# Patient Record
Sex: Female | Born: 1960 | Race: White | Hispanic: No | Smoking: Former smoker
Health system: Southern US, Community
[De-identification: ages and names within clinical notes are randomized; demographics above are authoritative.]

## PROBLEM LIST (undated history)

## (undated) DIAGNOSIS — R519 Headache, unspecified: Secondary | ICD-10-CM

## (undated) DIAGNOSIS — M549 Dorsalgia, unspecified: Secondary | ICD-10-CM

## (undated) DIAGNOSIS — M1990 Unspecified osteoarthritis, unspecified site: Secondary | ICD-10-CM

## (undated) DIAGNOSIS — N200 Calculus of kidney: Secondary | ICD-10-CM

## (undated) DIAGNOSIS — A048 Other specified bacterial intestinal infections: Secondary | ICD-10-CM

## (undated) DIAGNOSIS — K219 Gastro-esophageal reflux disease without esophagitis: Secondary | ICD-10-CM

## (undated) DIAGNOSIS — R51 Headache: Secondary | ICD-10-CM

## (undated) DIAGNOSIS — M545 Low back pain: Secondary | ICD-10-CM

## (undated) DIAGNOSIS — M797 Fibromyalgia: Secondary | ICD-10-CM

## (undated) DIAGNOSIS — Z720 Tobacco use: Secondary | ICD-10-CM

## (undated) DIAGNOSIS — I10 Essential (primary) hypertension: Secondary | ICD-10-CM

## (undated) HISTORY — PX: URETERAL STENT PLACEMENT: SHX822

## (undated) HISTORY — PX: SALPINGOOPHORECTOMY: SHX82

## (undated) HISTORY — PX: TUBAL LIGATION: SHX77

---

## 2000-05-19 ENCOUNTER — Other Ambulatory Visit: Admission: RE | Admit: 2000-05-19 | Discharge: 2000-05-19 | Payer: Self-pay | Admitting: Family Medicine

## 2005-08-18 ENCOUNTER — Ambulatory Visit: Payer: Self-pay | Admitting: Family Medicine

## 2005-09-01 ENCOUNTER — Ambulatory Visit: Payer: Self-pay | Admitting: Family Medicine

## 2007-10-13 ENCOUNTER — Emergency Department (HOSPITAL_COMMUNITY): Admission: EM | Admit: 2007-10-13 | Discharge: 2007-10-13 | Payer: Self-pay | Admitting: Emergency Medicine

## 2010-12-10 ENCOUNTER — Emergency Department (HOSPITAL_COMMUNITY): Payer: Medicaid Other

## 2010-12-10 ENCOUNTER — Encounter: Payer: Self-pay | Admitting: *Deleted

## 2010-12-10 ENCOUNTER — Observation Stay (HOSPITAL_COMMUNITY)
Admission: EM | Admit: 2010-12-10 | Discharge: 2010-12-12 | Disposition: A | Discharge status: Other Acute Inpatient Hospital | Payer: Medicaid Other | Attending: Internal Medicine | Admitting: Internal Medicine

## 2010-12-10 ENCOUNTER — Other Ambulatory Visit: Payer: Self-pay

## 2010-12-10 DIAGNOSIS — F17200 Nicotine dependence, unspecified, uncomplicated: Secondary | ICD-10-CM | POA: Insufficient documentation

## 2010-12-10 DIAGNOSIS — M545 Low back pain, unspecified: Secondary | ICD-10-CM

## 2010-12-10 DIAGNOSIS — Z720 Tobacco use: Secondary | ICD-10-CM

## 2010-12-10 DIAGNOSIS — R1013 Epigastric pain: Secondary | ICD-10-CM | POA: Insufficient documentation

## 2010-12-10 DIAGNOSIS — R519 Headache, unspecified: Secondary | ICD-10-CM | POA: Diagnosis present

## 2010-12-10 DIAGNOSIS — A048 Other specified bacterial intestinal infections: Secondary | ICD-10-CM

## 2010-12-10 DIAGNOSIS — E785 Hyperlipidemia, unspecified: Secondary | ICD-10-CM | POA: Insufficient documentation

## 2010-12-10 DIAGNOSIS — R079 Chest pain, unspecified: Secondary | ICD-10-CM | POA: Diagnosis present

## 2010-12-10 DIAGNOSIS — R072 Precordial pain: Principal | ICD-10-CM | POA: Insufficient documentation

## 2010-12-10 DIAGNOSIS — I10 Essential (primary) hypertension: Secondary | ICD-10-CM

## 2010-12-10 DIAGNOSIS — K219 Gastro-esophageal reflux disease without esophagitis: Secondary | ICD-10-CM

## 2010-12-10 DIAGNOSIS — Z8249 Family history of ischemic heart disease and other diseases of the circulatory system: Secondary | ICD-10-CM | POA: Insufficient documentation

## 2010-12-10 DIAGNOSIS — R51 Headache: Secondary | ICD-10-CM | POA: Insufficient documentation

## 2010-12-10 DIAGNOSIS — D72829 Elevated white blood cell count, unspecified: Secondary | ICD-10-CM | POA: Diagnosis present

## 2010-12-10 DIAGNOSIS — G894 Chronic pain syndrome: Secondary | ICD-10-CM | POA: Insufficient documentation

## 2010-12-10 HISTORY — DX: Low back pain: M54.5

## 2010-12-10 HISTORY — DX: Fibromyalgia: M79.7

## 2010-12-10 HISTORY — DX: Calculus of kidney: N20.0

## 2010-12-10 HISTORY — DX: Gastro-esophageal reflux disease without esophagitis: K21.9

## 2010-12-10 HISTORY — DX: Essential (primary) hypertension: I10

## 2010-12-10 HISTORY — DX: Unspecified osteoarthritis, unspecified site: M19.90

## 2010-12-10 HISTORY — DX: Low back pain, unspecified: M54.50

## 2010-12-10 HISTORY — DX: Tobacco use: Z72.0

## 2010-12-10 HISTORY — DX: Dorsalgia, unspecified: M54.9

## 2010-12-10 HISTORY — DX: Other specified bacterial intestinal infections: A04.8

## 2010-12-10 HISTORY — DX: Headache: R51

## 2010-12-10 HISTORY — DX: Headache, unspecified: R51.9

## 2010-12-10 LAB — COMPREHENSIVE METABOLIC PANEL
AST: 14 U/L (ref 0–37)
BUN: 17 mg/dL (ref 6–23)
CO2: 25 mEq/L (ref 19–32)
Calcium: 9.8 mg/dL (ref 8.4–10.5)
Chloride: 102 mEq/L (ref 96–112)
Creatinine, Ser: 0.7 mg/dL (ref 0.50–1.10)
GFR calc Af Amer: >90 mL/min (ref >90)
GFR calc non Af Amer: >90 mL/min (ref >90)
Glucose, Bld: 106 mg/dL — ABNORMAL HIGH (ref 70–99)
Total Bilirubin: 0.4 mg/dL (ref 0.3–1.2)

## 2010-12-10 LAB — CBC
HCT: 47.2 % — ABNORMAL HIGH (ref 36.0–46.0)
Hemoglobin: 16.2 g/dL — ABNORMAL HIGH (ref 12.0–15.0)
MCV: 92 fL (ref 78.0–100.0)
RBC: 5.13 MIL/uL — ABNORMAL HIGH (ref 3.87–5.11)
RDW: 12.4 % (ref 11.5–15.5)
WBC: 12.5 10*3/uL — ABNORMAL HIGH (ref 4.0–10.5)

## 2010-12-10 LAB — CARDIAC PANEL(CRET KIN+CKTOT+MB+TROPI)
Relative Index: INVALID (ref 0.0–2.5)
Total CK: 62 U/L (ref 7–177)
Troponin I: <0.3 ng/mL (ref <0.30)

## 2010-12-10 LAB — URINALYSIS, ROUTINE W REFLEX MICROSCOPIC
Ketones, ur: NEGATIVE mg/dL
Leukocytes, UA: NEGATIVE
Nitrite: NEGATIVE
Specific Gravity, Urine: >1.03 — ABNORMAL HIGH (ref 1.005–1.030)
Urobilinogen, UA: 0.2 mg/dL (ref 0.0–1.0)
pH: 5.5 (ref 5.0–8.0)

## 2010-12-10 LAB — DIFFERENTIAL
Basophils Absolute: 0.1 10*3/uL (ref 0.0–0.1)
Eosinophils Relative: 1 % (ref 0–5)
Lymphocytes Relative: 26 % (ref 12–46)
Lymphs Abs: 3.2 10*3/uL (ref 0.7–4.0)
Monocytes Absolute: 0.7 10*3/uL (ref 0.1–1.0)
Monocytes Relative: 5 % (ref 3–12)
Neutro Abs: 8.4 10*3/uL — ABNORMAL HIGH (ref 1.7–7.7)

## 2010-12-10 LAB — LIPASE, BLOOD: Lipase: 76 U/L — ABNORMAL HIGH (ref 11–59)

## 2010-12-10 LAB — URINE MICROSCOPIC-ADD ON

## 2010-12-10 LAB — POCT I-STAT TROPONIN I

## 2010-12-10 MED ORDER — ALUM & MAG HYDROXIDE-SIMETH 200-200-20 MG/5ML PO SUSP: 30.0000 mL | Freq: Four times a day (QID) | ORAL | Status: DC | PRN

## 2010-12-10 MED ORDER — ALBUTEROL SULFATE (5 MG/ML) 0.5% IN NEBU: 2.5000 mg | INHALATION_SOLUTION | RESPIRATORY_TRACT | Status: DC | PRN

## 2010-12-10 MED ORDER — SODIUM CHLORIDE 0.9 % IJ SOLN
3.0000 mL | INTRAMUSCULAR | Status: DC | PRN
  Administered 2010-12-10: 3 mL via INTRAVENOUS
  Filled 2010-12-10: qty 3

## 2010-12-10 MED ORDER — ONDANSETRON HCL 4 MG PO TABS: 4.0000 mg | ORAL_TABLET | Freq: Four times a day (QID) | ORAL | Status: DC | PRN

## 2010-12-10 MED ORDER — METOPROLOL SUCCINATE ER 25 MG PO TB24
12.5000 mg | ORAL_TABLET | Freq: Two times a day (BID) | ORAL | Status: DC
  Administered 2010-12-10 – 2010-12-11 (×2): 12.5 mg via ORAL
  Filled 2010-12-10 (×2): qty 1

## 2010-12-10 MED ORDER — CYCLOBENZAPRINE HCL 10 MG PO TABS
10.0000 mg | ORAL_TABLET | Freq: Every day | ORAL | Status: DC
Start: 2010-12-10 — End: 2010-12-12
  Administered 2010-12-10 – 2010-12-11 (×2): 10 mg via ORAL
  Filled 2010-12-10 (×2): qty 1

## 2010-12-10 MED ORDER — ASPIRIN 81 MG PO CHEW
81.0000 mg | CHEWABLE_TABLET | Freq: Every day | ORAL | Status: DC
  Administered 2010-12-11: 81 mg via ORAL
  Filled 2010-12-10: qty 1

## 2010-12-10 MED ORDER — NITROGLYCERIN 2 % TD OINT
1.0000 [in_us] | TOPICAL_OINTMENT | Freq: Once | TRANSDERMAL | Status: AC
  Administered 2010-12-10: 66.6667 [in_us] via TOPICAL
  Filled 2010-12-10: qty 1

## 2010-12-10 MED ORDER — GUAIFENESIN-DM 100-10 MG/5ML PO SYRP: 5.0000 mL | ORAL_SOLUTION | ORAL | Status: DC | PRN

## 2010-12-10 MED ORDER — SODIUM CHLORIDE 0.45 % IV SOLN
INTRAVENOUS | Status: DC
  Administered 2010-12-10 – 2010-12-11 (×2): via INTRAVENOUS
  Filled 2010-12-10 (×5): qty 1000

## 2010-12-10 MED ORDER — ENOXAPARIN SODIUM 40 MG/0.4ML ~~LOC~~ SOLN
40.0000 mg | SUBCUTANEOUS | Status: DC
  Administered 2010-12-10 – 2010-12-11 (×2): 40 mg via SUBCUTANEOUS
  Filled 2010-12-10 (×2): qty 0.4

## 2010-12-10 MED ORDER — DOCUSATE SODIUM 100 MG PO CAPS
100.0000 mg | ORAL_CAPSULE | Freq: Two times a day (BID) | ORAL | Status: DC
  Administered 2010-12-10 – 2010-12-11 (×3): 100 mg via ORAL
  Filled 2010-12-10 (×3): qty 1

## 2010-12-10 MED ORDER — ACETAMINOPHEN 325 MG PO TABS
650.0000 mg | ORAL_TABLET | Freq: Four times a day (QID) | ORAL | Status: DC | PRN
  Administered 2010-12-10 – 2010-12-11 (×2): 650 mg via ORAL
  Filled 2010-12-10 (×2): qty 2

## 2010-12-10 MED ORDER — ONDANSETRON HCL 4 MG/2ML IJ SOLN: 4.0000 mg | Freq: Four times a day (QID) | INTRAMUSCULAR | Status: DC | PRN

## 2010-12-10 MED ORDER — ASPIRIN 325 MG PO TABS
325.0000 mg | ORAL_TABLET | Freq: Once | ORAL | Status: AC
  Administered 2010-12-10: 325 mg via ORAL
  Filled 2010-12-10: qty 1

## 2010-12-10 MED ORDER — POTASSIUM CHLORIDE IN NACL 20-0.45 MEQ/L-% IV SOLN
INTRAVENOUS | Status: AC
  Filled 2010-12-10: qty 1000

## 2010-12-10 MED ORDER — MORPHINE SULFATE 2 MG/ML IJ SOLN: 2.0000 mg | INTRAMUSCULAR | Status: DC | PRN

## 2010-12-10 MED ORDER — ACETAMINOPHEN 650 MG RE SUPP: 650.0000 mg | Freq: Four times a day (QID) | RECTAL | Status: DC | PRN

## 2010-12-10 MED ORDER — OXYCODONE HCL 5 MG PO TABS
5.0000 mg | ORAL_TABLET | ORAL | Status: DC | PRN
  Administered 2010-12-10: 5 mg via ORAL
  Filled 2010-12-10: qty 1

## 2010-12-10 MED ORDER — TRAMADOL HCL 50 MG PO TABS: 50.0000 mg | ORAL_TABLET | Freq: Four times a day (QID) | ORAL | Status: DC | PRN

## 2010-12-10 MED ORDER — NITROGLYCERIN 0.4 MG/SPRAY TL SOLN: 1.0000 | Status: DC | PRN

## 2010-12-10 MED ORDER — PANTOPRAZOLE SODIUM 40 MG PO TBEC
40.0000 mg | DELAYED_RELEASE_TABLET | Freq: Every day | ORAL | Status: DC
  Administered 2010-12-10 – 2010-12-11 (×2): 40 mg via ORAL
  Filled 2010-12-10 (×2): qty 1

## 2010-12-10 MED ORDER — NITROGLYCERIN 0.4 MG SL SUBL: 0.4000 mg | SUBLINGUAL_TABLET | SUBLINGUAL | Status: DC | PRN

## 2010-12-10 NOTE — ED Notes (Signed)
To X-ray via carrier--Stable

## 2010-12-10 NOTE — ED Notes (Signed)
Report called to Clydie Braun for Room 308

## 2010-12-10 NOTE — ED Provider Notes (Signed)
History     CSN: [id number] Arrival date & time: 12/10/2010  1:17 PM  Chief Complaint  Patient presents with  . Chest Pain    (Consider location/radiation/quality/duration/timing/severity/associated sxs/prior treatment) Patient is a 50 y.o. female presenting with chest pain. The history is provided by the patient.  Chest Pain The chest pain began yesterday (Patient states that she started with chest pain last night she also had some shortness of breath weakness and jaw pain it lasted about 15-20 minutes and had some pain off and on the pain is a pressure sensation). Duration of episode(s) is 20 minutes. Chest pain occurs intermittently. The chest pain is improving. The pain is associated with stress. At its most intense, the pain is at 6/10. The pain is currently at 1/10. The quality of the pain is described as aching, dull and heavy. The pain radiates to the left jaw. Chest pain is worsened by stress. Primary symptoms include fatigue and shortness of breath. Pertinent negatives for primary symptoms include no fever, no syncope, no cough, no wheezing, no palpitations and no abdominal pain. She tried nothing for the symptoms. Risk factors include smoking/tobacco exposure (Both of the patient's parents died of MI one at 39 and 59 she has history of hypertension that's not treated and she is a smoker).  Pertinent negatives for past medical history include no aneurysm, no Marfan's syndrome and no seizures.  Her family medical history is significant for heart disease in family.  Procedure history is negative for cardiac catheterization.     Past Medical History  Diagnosis Date  . Back pain   . Osteoarthritis   . Fibromyalgia     Past Surgical History  Procedure Date  . Peg tube removal   . Tubal ligation   . Ureteral stent placement     History reviewed. No pertinent family history.  History  Substance Use Topics  . Smoking status: Current Everyday Smoker -- 0.5 packs/day  .  Smokeless tobacco: Not on file  . Alcohol Use: No    OB History    Grav Para Term Preterm Abortions TAB SAB Ect Mult Living                  Review of Systems  Constitutional: Positive for fatigue. Negative for fever.  HENT: Negative for congestion, sinus pressure and ear discharge.   Eyes: Negative for discharge.  Respiratory: Positive for shortness of breath. Negative for cough and wheezing.   Cardiovascular: Positive for chest pain. Negative for palpitations and syncope.  Gastrointestinal: Negative for abdominal pain and diarrhea.  Genitourinary: Negative for frequency and hematuria.  Musculoskeletal: Negative for back pain.  Skin: Negative for rash.  Neurological: Negative for seizures and headaches.  Hematological: Negative.   Psychiatric/Behavioral: Negative for hallucinations.    Allergies  Review of patient's allergies indicates no known allergies.  Home Medications   Current Outpatient Rx  Name Route Sig Dispense Refill  . CYCLOBENZAPRINE HCL 10 MG PO TABS Oral Take 10 mg by mouth daily.      . IBUPROFEN 200 MG PO TABS Oral Take 200 mg by mouth every 6 (six) hours as needed. For pain     . MEDROXYPROGESTERONE ACETATE 10 MG PO TABS Oral Take 10 mg by mouth daily.      Marland Kitchen NAPROXEN SODIUM 220 MG PO TABS Oral Take 220 mg by mouth every 8 (eight) hours as needed. For pain     . TRAMADOL HCL 50 MG PO TABS Oral Take 50 mg  by mouth every 6 (six) hours as needed. For pain       BP 129/91  Pulse 83  Temp(Src) 97.9 F (36.6 C) (Oral)  Resp 16  Ht 5\' 4"  (1.626 m)  Wt 152 lb (68.947 kg)  BMI 26.09 kg/m2  SpO2 98%  Physical Exam  Constitutional: She is oriented to person, place, and time. She appears well-developed.  HENT:  Head: Normocephalic and atraumatic.  Eyes: Conjunctivae and EOM are normal. No scleral icterus.  Neck: Neck supple. No thyromegaly present.  Cardiovascular: Normal rate and regular rhythm.  Exam reveals no gallop and no friction rub.   No murmur  heard. Pulmonary/Chest: No stridor. She has no wheezes. She has no rales. She exhibits no tenderness.  Abdominal: She exhibits no distension. There is no tenderness. There is no rebound.  Musculoskeletal: Normal range of motion. She exhibits no edema.  Lymphadenopathy:    She has no cervical adenopathy.  Neurological: She is oriented to person, place, and time. Coordination normal.  Skin: No rash noted. No erythema.  Psychiatric: She has a normal mood and affect. Her behavior is normal.    ED Course  Procedures (including critical care time)  Labs Reviewed  CBC - Abnormal; Notable for the following:    WBC 12.5 (*)    RBC 5.13 (*)    Hemoglobin 16.2 (*)    HCT 47.2 (*)    All other components within normal limits  DIFFERENTIAL - Abnormal; Notable for the following:    Neutro Abs 8.4 (*)    All other components within normal limits  COMPREHENSIVE METABOLIC PANEL - Abnormal; Notable for the following:    Glucose, Bld 106 (*)    All other components within normal limits  I-STAT TROPONIN I   Dg Chest 2 View  12/10/2010  *RADIOLOGY REPORT*  Clinical Data: Chest pain  CHEST - 2 VIEW  Comparison: None.  Findings: Normal heart size.  Clear lungs.  Mild bronchitic change. No pneumothorax or pleural fluid.  IMPRESSION: No active cardiopulmonary disease.  Original Report Authenticated By: Donavan Burnet, M.D.     1. Chest pain    Results for orders placed during the hospital encounter of 12/10/10  CBC      Component Value Range   WBC 12.5 (*) 4.0 - 10.5 (K/uL)   RBC 5.13 (*) 3.87 - 5.11 (MIL/uL)   Hemoglobin 16.2 (*) 12.0 - 15.0 (g/dL)   HCT 40.9 (*) 81.1 - 46.0 (%)   MCV 92.0  78.0 - 100.0 (fL)   MCH 31.6  26.0 - 34.0 (pg)   MCHC 34.3  30.0 - 36.0 (g/dL)   RDW 91.4  78.2 - 95.6 (%)   Platelets 330  150 - 400 (K/uL)  DIFFERENTIAL      Component Value Range   Neutrophils Relative 67  43 - 77 (%)   Neutro Abs 8.4 (*) 1.7 - 7.7 (K/uL)   Lymphocytes Relative 26  12 - 46 (%)    Lymphs Abs 3.2  0.7 - 4.0 (K/uL)   Monocytes Relative 5  3 - 12 (%)   Monocytes Absolute 0.7  0.1 - 1.0 (K/uL)   Eosinophils Relative 1  0 - 5 (%)   Eosinophils Absolute 0.2  0.0 - 0.7 (K/uL)   Basophils Relative 0  0 - 1 (%)   Basophils Absolute 0.1  0.0 - 0.1 (K/uL)  COMPREHENSIVE METABOLIC PANEL      Component Value Range   Sodium 138  135 - 145 (  mEq/L)   Potassium 4.3  3.5 - 5.1 (mEq/L)   Chloride 102  96 - 112 (mEq/L)   CO2 25  19 - 32 (mEq/L)   Glucose, Bld 106 (*) 70 - 99 (mg/dL)   BUN 17  6 - 23 (mg/dL)   Creatinine, Ser 5.28  0.50 - 1.10 (mg/dL)   Calcium 9.8  8.4 - 41.3 (mg/dL)   Total Protein 7.3  6.0 - 8.3 (g/dL)   Albumin 4.1  3.5 - 5.2 (g/dL)   AST 14  0 - 37 (U/L)   ALT 15  0 - 35 (U/L)   Alkaline Phosphatase 80  39 - 117 (U/L)   Total Bilirubin 0.4  0.3 - 1.2 (mg/dL)   GFR calc non Af Amer >90  >90 (mL/min)   GFR calc Af Amer >90  >90 (mL/min)   Dg Chest 2 View  12/10/2010  *RADIOLOGY REPORT*  Clinical Data: Chest pain  CHEST - 2 VIEW  Comparison: None.  Findings: Normal heart size.  Clear lungs.  Mild bronchitic change. No pneumothorax or pleural fluid.  IMPRESSION: No active cardiopulmonary disease.  Original Report Authenticated By: Donavan Burnet, M.D.     Date: 12/10/2010  Rate: 102  Rhythm: sinus tachycardia  QRS Axis: normal  Intervals: normal  ST/T Wave abnormalities: nonspecific T wave changes  Inverted t wave in lead 3.  Right atrial enlargement  Conduction Disutrbances:none  Narrative Interpretation:   Old EKG Reviewed: none available     MDM  Chest pain,  Angina, bronchitis, anxiety        Benny Lennert, MD 12/10/10 1525
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
## 2010-12-10 NOTE — ED Notes (Signed)
Returned to ER--placed back on monitor-- B/P 133/99  HR 77 SR  R 20--Reports complete resolution of chest discomfort since arriving and lying down.--placed back on 02 at 2 liters  P.o. 98%

## 2010-12-10 NOTE — ED Notes (Signed)
Tylenol R/S order is error---

## 2010-12-10 NOTE — ED Notes (Signed)
No Change in status--SR on monitor

## 2010-12-10 NOTE — ED Notes (Signed)
C/o headache---continues to report resolution of chest pain

## 2010-12-10 NOTE — ED Notes (Signed)
I STAT Trop result  0.00

## 2010-12-10 NOTE — ED Notes (Signed)
Pt c/o cp described as heaviness in the center of her chest. Pt states pain started yesterday with headache, epigastric, throat, jaw, and upper back. Pt states she has no other pain besides cp at this time.

## 2010-12-10 NOTE — ED Notes (Addendum)
C/o mid-sternal chest pain onset last p.m. Radiating thru to back and up to jaws bilaterally---nearly resolved spontaneoulsy--today mid sternal pressure like sensation increases with ambulation--rates pain a 3 on 1-10 scale--sinus rhythm on monitor--skin warm and dry--placed on 02 at 2 liters via nasal cannula  Smokes 1/2 pack per day for the past 20+ yrs.

## 2010-12-10 NOTE — ED Notes (Signed)
B/P 144/94  HR 74 SR--Dr. Estell Harpin discussing the possibility of admission with pt and she is agreeable.

## 2010-12-11 ENCOUNTER — Other Ambulatory Visit: Payer: Self-pay

## 2010-12-11 ENCOUNTER — Encounter (HOSPITAL_COMMUNITY): Payer: Self-pay | Admitting: Adult Health

## 2010-12-11 DIAGNOSIS — R079 Chest pain, unspecified: Secondary | ICD-10-CM

## 2010-12-11 DIAGNOSIS — E785 Hyperlipidemia, unspecified: Secondary | ICD-10-CM | POA: Diagnosis present

## 2010-12-11 LAB — BASIC METABOLIC PANEL
BUN: 18 mg/dL (ref 6–23)
Calcium: 9.3 mg/dL (ref 8.4–10.5)
Creatinine, Ser: 0.77 mg/dL (ref 0.50–1.10)
GFR calc Af Amer: >90 mL/min (ref >90)
GFR calc non Af Amer: >90 mL/min (ref >90)
Potassium: 4.1 mEq/L (ref 3.5–5.1)

## 2010-12-11 LAB — CBC
Hemoglobin: 15.1 g/dL — ABNORMAL HIGH (ref 12.0–15.0)
MCHC: 33.9 g/dL (ref 30.0–36.0)
Platelets: 320 10*3/uL (ref 150–400)
RDW: 12.6 % (ref 11.5–15.5)

## 2010-12-11 LAB — TSH: TSH: 2.28 u[IU]/mL (ref 0.350–4.500)

## 2010-12-11 LAB — LIPID PANEL
Cholesterol: 262 mg/dL — ABNORMAL HIGH (ref 0–200)
HDL: 37 mg/dL — ABNORMAL LOW (ref >39)
Total CHOL/HDL Ratio: 7.1 RATIO
VLDL: 66 mg/dL — ABNORMAL HIGH (ref 0–40)

## 2010-12-11 LAB — CARDIAC PANEL(CRET KIN+CKTOT+MB+TROPI)
CK, MB: 1.5 ng/mL (ref 0.3–4.0)
Total CK: 54 U/L (ref 7–177)

## 2010-12-11 MED ORDER — ENOXAPARIN SODIUM 80 MG/0.8ML ~~LOC~~ SOLN
70.0000 mg | Freq: Two times a day (BID) | SUBCUTANEOUS | Status: DC
  Administered 2010-12-12: 70 mg via SUBCUTANEOUS
  Filled 2010-12-11: qty 0.8

## 2010-12-11 MED ORDER — ENOXAPARIN SODIUM 30 MG/0.3ML ~~LOC~~ SOLN
30.0000 mg | Freq: Once | SUBCUTANEOUS | Status: AC
  Administered 2010-12-11: 30 mg via SUBCUTANEOUS
  Filled 2010-12-11: qty 0.3

## 2010-12-11 MED ORDER — SODIUM CHLORIDE 0.9 % IV SOLN
INTRAVENOUS | Status: DC
  Administered 2010-12-12: 06:00:00 via INTRAVENOUS

## 2010-12-11 MED ORDER — ROSUVASTATIN CALCIUM 20 MG PO TABS: 40.0000 mg | ORAL_TABLET | Freq: Every day | ORAL | Status: DC

## 2010-12-11 MED ORDER — NITROGLYCERIN IN D5W 200-5 MCG/ML-% IV SOLN: 10.0000 ug/min | INTRAVENOUS | Status: DC | PRN

## 2010-12-11 MED ORDER — TRAZODONE HCL 50 MG PO TABS
25.0000 mg | ORAL_TABLET | Freq: Every day | ORAL | Status: DC
  Administered 2010-12-11: 25 mg via ORAL
  Filled 2010-12-11: qty 1

## 2010-12-11 MED ORDER — METOPROLOL SUCCINATE ER 25 MG PO TB24
25.0000 mg | ORAL_TABLET | Freq: Three times a day (TID) | ORAL | Status: DC
  Administered 2010-12-11 (×2): 25 mg via ORAL
  Filled 2010-12-11 (×2): qty 1

## 2010-12-11 MED ORDER — ENOXAPARIN SODIUM 80 MG/0.8ML ~~LOC~~ SOLN: 70.0000 mg | Freq: Two times a day (BID) | SUBCUTANEOUS | Status: DC

## 2010-12-11 MED ORDER — ROSUVASTATIN CALCIUM 20 MG PO TABS
20.0000 mg | ORAL_TABLET | Freq: Every day | ORAL | Status: DC
  Administered 2010-12-11: 20 mg via ORAL
  Filled 2010-12-11: qty 1

## 2010-12-11 NOTE — Consult Note (Signed)
CARDIOLOGY CONSULT NOTE  Patient ID: Sarah Vargas MRN: [id number] DOB/AGE: 1960/05/10 50 y.o.  Admit date: 12/10/2010 Referring PhysicianTRH Primary Physician: Health Dept Primary Cardiologist(New) Maeva Dant Reason for Consultation: Chest pain  HPI: 50 y/o patient with no prior cardiac history presents to ER after experiencing chest pain the day prior. She began with a headache, climbed some stairs in her apartment and began to have chest pain described as heaviness, jaw pain bilaterally described as sharp/ache, with associated tightness in her throat.  She denies dyspnea, diaphoresis or nausea.  After 10 min of rest, pain abated. She continued to rest another 15 minutes. Felt better the rest of the day without recurrence.  Had neighbor take her BP and she states that it was 140/103.  The following day she was seen at [facility] and expressed her symptoms to them. She was sent to ER for further evaluation.  On arrival, she was hypertensive with BP 158/106. HR 117.  She was given SL NTG and then placed on patch. Given ASA 325 mg.  Admitted to r/o cardiac etiology of chest discomfort.  Initial EKG showed sinus tach with rate of 102 bpm, with T-wave inversion in Lead III and aVR. This am there is T-wave inversion in V1, V3, and Lead III, and flattening in AvF.  Troponin negative X 2 so far. Currently she is pain free.  Review of systems complete and found to be negative unless listed above  Past Medical History  Diagnosis Date  . Back pain   . Osteoarthritis   . Fibromyalgia   . Tobacco abuse 12/10/2010  . HTN (hypertension) 12/10/2010  . GERD (gastroesophageal reflux disease) 12/10/2010  . Positive H. pylori test 12/10/2010    Treated 2011.  . Chest pain     12/2010.  Marland Kitchen LBP (low back pain) 12/10/2010  . Kidney stones     s/p ureteral stents-removed in past.  . Generalized headaches     Family History  Problem Relation Age of Onset  . Heart attack Father   . Heart attack Mother   .  Hypertension Mother   . Hypertension Father     History   Social History  . Marital Status: Single    Spouse Name: N/A    Number of Children: N/A  . Years of Education: N/A   Occupational History  .      Unemployed   Social History Main Topics  . Smoking status: Current Everyday Smoker -- 0.5 packs/day for 30 years  . Smokeless tobacco: Not on file  . Alcohol Use: No  . Drug Use: No  . Sexually Active: Not on file   Other Topics Concern  . Not on file   Social History Narrative   [place]    Past Surgical History  Procedure Date  . Salpingoophorectomy     For hx of tubal pregnancy  . Tubal ligation   . Ureteral stent placement      Prescriptions prior to admission  Medication Sig Dispense Refill  . cyclobenzaprine (FLEXERIL) 10 MG tablet Take 10 mg by mouth daily.        Marland Kitchen ibuprofen (ADVIL,MOTRIN) 200 MG tablet Take 200 mg by mouth every 6 (six) hours as needed. For pain       . medroxyPROGESTERone (PROVERA) 10 MG tablet Take 10 mg by mouth daily.        . naproxen sodium (ANAPROX) 220 MG tablet Take 220 mg by mouth every 8 (eight) hours as needed.  For pain       . traMADol (ULTRAM) 50 MG tablet Take 50 mg by mouth every 6 (six) hours as needed. For pain         Physical Exam: Blood pressure 102/68, pulse 70, temperature 98.5 F (36.9 C), temperature source Oral, resp. rate 17, height 5\' 4"  (1.626 m), weight 152 lb (68.947 kg), SpO2 97.00%.    General: Well developed, well nourished, in no acute distress Head: Eyes PERRLA, No xanthomas.   Normal cephalic and atramatic  Lungs: Clear bilaterally to auscultation and percussion. Heart: HRRR S1 S2, without MRG.  Pulses are 2+ & equal.            No carotid bruit. No JVD.  No abdominal bruits. No femoral bruits. Abdomen: Bowel sounds are positive, abdomen soft and non-tender without masses or                  Hernia's noted. Msk:  Back normal, normal gait. Normal strength and tone for  age. Extremities: No clubbing, cyanosis or edema.  DP +1, No femoral bruits. Neuro: Alert and oriented X 3. Psych:  Good affect, responds appropriately  Labs:   Lab Results  Component Value Date   WBC 14.0* 12/11/2010   HGB 15.1* 12/11/2010   HCT 44.6 12/11/2010   MCV 93.1 12/11/2010   PLT 320 12/11/2010     Lab 12/11/10 0011 12/10/10 1418  NA 136 --  K 4.1 --  CL 101 --  CO2 22 --  BUN 18 --  CREATININE 0.77 --  CALCIUM 9.3 --  PROT -- 7.3  BILITOT -- 0.4  ALKPHOS -- 80  ALT -- 15  AST -- 14  GLUCOSE 82 --   Lab Results  Component Value Date   CKTOTAL 54 12/11/2010   CKMB 1.5 12/11/2010   TROPONINI <0.30 12/11/2010    Lab Results  Component Value Date   CHOL 262* 12/11/2010   Lab Results  Component Value Date   HDL 37* 12/11/2010   Lab Results  Component Value Date   LDLCALC 159* 12/11/2010   Lab Results  Component Value Date   TRIG 331* 12/11/2010   Lab Results  Component Value Date   CHOLHDL 7.1 12/11/2010       Radiology:Chest X-Ray (12/10/2010) IMPRESSION: No active cardiopulmonary disease. CT scan of chest:  No pulmonary emboli; calcification of aorta and coronary arteries.   EKG: SR, rate of 64 bpm. T-wave inversion noted in V1, V3, III and aVr, with flattening AvF.  ASSESSMENT AND PLAN:   1. Chest Pain:  She has multiple CVRF's smoking, family history, HTN, newly diagnosed hypercholesterolemia.  She is on DVT prophylaxis with LMWH, ASA, BB. Recommend further cardiac testing. Cardiac catheterization would be reasonable in the setting of EKG changes from admission.  Will begin statin.  2. Hypercholesterolemia:  Newly diagnosed. TC of 262, HDL 37, LDL 159, Trig 331.  Will begin crestor 20 mg daily at HS with dose today.   3. Hypertension: Does not seem to be well controled as an OP with elevation at home, and on arrival in ER,.  Currently well controlled as inpt at this time.  4. Tobacco Abuse: Smoking cessation is recommended. Signed: Bettey Mare.  Lyman Bishop NP [facility] 12/11/2010, 9:18 AM ------------------------------------------------------ Cardiology Attending  Patient interviewed and examined. Discussed with Joni Reining, NP.  Above note annotated and modified based upon my findings.  Quality of symptoms is quite worrisome for coronary disease, but EKG abnormalities  are nonspecific and cardiac markers are negative. Physical examination is benign. CT scan suggests the presence of coronary disease, but does not specify the severity. Will be necessary to determine whether or not this is a true acute coronary syndrome. The working diagnosis is unstable angina. The risks and benefits of cardiac catheterization have been explained to the patient including CVA, acute MI, renal failure, anaphylaxis and death. She agrees to proceed. We will arrange for transfer to [facility], left heart catheter and coronary angiography in the morning.  [place] Bing, MD

## 2010-12-11 NOTE — Progress Notes (Addendum)
Subjective: The patient has no complaints of chest pain currently.  Objective: Vital signs in last 24 hours: Filed Vitals:   12/10/10 2229 12/11/10 0643 12/11/10 0805 12/11/10 1616  BP: 99/65 102/68    Pulse: 77 65 70 72  Temp: 98.4 F (36.9 C) 98.5 F (36.9 C)    TempSrc: Oral     Resp: 20 18 17 18   Height:      Weight:      SpO2: 96% 97% 97% 96%    Intake/Output Summary (Last 24 hours) at 12/11/10 1714 Last data filed at 12/11/10 1630  Gross per 24 hour  Intake 641.67 ml  Output      0 ml  Net 641.67 ml    Weight change:   Exam: The patient is currently sitting up in bed, in no acute distress. Lungs: Clear to auscultation bilaterally. Heart: S1, S2, with no murmurs rubs or gallops. Abdomen: Bowel sounds, nontender, nondistended. Extremities: No pedal edema.  Lab Results: Basic Metabolic Panel:  Basename 12/11/10 0011 12/10/10 1418  NA 136 138  K 4.1 4.3  CL 101 102  CO2 22 25  GLUCOSE 82 106*  BUN 18 17  CREATININE 0.77 0.70  CALCIUM 9.3 9.8  MG -- --  PHOS -- --   Liver Function Tests:  Basename 12/10/10 1418  AST 14  ALT 15  ALKPHOS 80  BILITOT 0.4  PROT 7.3  ALBUMIN 4.1    Basename 12/10/10 1418  LIPASE 76*  AMYLASE --   No results found for this basename: AMMONIA:2 in the last 72 hours CBC:  Basename 12/11/10 0011 12/10/10 1418  WBC 14.0* 12.5*  NEUTROABS -- 8.4*  HGB 15.1* 16.2*  HCT 44.6 47.2*  MCV 93.1 92.0  PLT 320 330   Cardiac Enzymes:  Basename 12/11/10 0530 12/11/10 0005 12/10/10 1418  CKTOTAL 54 50 62  CKMB 1.5 1.6 1.6  CKMBINDEX -- -- --  TROPONINI <0.30 <0.30 <0.30   BNP: No results found for this basename: POCBNP:3 in the last 72 hours D-Dimer: No results found for this basename: DDIMER:2 in the last 72 hours CBG: No results found for this basename: GLUCAP:6 in the last 72 hours Hemoglobin A1C: No results found for this basename: HGBA1C in the last 72 hours Fasting Lipid Panel:  Basename 12/11/10 0005    CHOL 262*  HDL 37*  LDLCALC 159*  TRIG 331*  CHOLHDL 7.1  LDLDIRECT --   Thyroid Function Tests:  Basename 12/10/10 1418  TSH 0.808  T4TOTAL --  FREET4 1.32  T3FREE --  THYROIDAB --   Anemia Panel: No results found for this basename: VITAMINB12,FOLATE,FERRITIN,TIBC,IRON,RETICCTPCT in the last 72 hours Urine Drug Screen:  Alcohol Level: No results found for this basename: ETH:2 in the last 72 hours Urinalysis:  Misc. Labs:  Micro: No results found for this or any previous visit (from the past 240 hour(s)).  Studies/Results: Dg Chest 2 View  12/10/2010  *RADIOLOGY REPORT*  Clinical Data: Chest pain  CHEST - 2 VIEW  Comparison: None.  Findings: Normal heart size.  Clear lungs.  Mild bronchitic change. No pneumothorax or pleural fluid.  IMPRESSION: No active cardiopulmonary disease.  Original Report Authenticated By: Donavan Burnet, M.D.   US Abdomen Complete  12/11/2010  *RADIOLOGY REPORT*  Clinical Data:  Epigastric pain  ULTRASOUND ABDOMEN:  Technique:  Sonography of upper abdominal structures was performed.  Comparison:  None  Gallbladder:  Normally distended without stones or wall thickening. No pericholecystic fluid or sonographic Murphy sign.  Common bile duct:  Normal caliber 3 mm diameter  Liver:  Echogenic, likely fatty infiltration, though this can be seen with cirrhosis and certain infiltrative disorders.  Tiny area of slightly decreased echogenicity is seen adjacent the gallbladder fossa, question area of focal sparing.  No definite mass lesion or nodularity.  Hepatopetal portal venous flow.  IVC:  Normal appearance  Pancreas:  Suboptimal visualization due to body habitus; small portions of the body and proximal tail are visualized and normal appearance with remainder obscured.  Spleen:  Normal appearance, 5.4 cm length  Right kidney:  11.8 cm length. Normal morphology without mass or hydronephrosis.  Left kidney:  10.9 cm length. Normal morphology without mass or  hydronephrosis.  Aorta:  Normal,  Other:  Are no free fluid  IMPRESSION: Probable fatty infiltration of liver as above with suspected area of focal sparing. Incomplete pancreatic visualization.  Original Report Authenticated By: Lollie Marrow, M.D.    Medications: I have reviewed the patient's current medications.  Assessment: Active Problems:  Chest pain  Tobacco abuse  HTN (hypertension)  GERD (gastroesophageal reflux disease)  Positive H. pylori test  Headache  LBP (low back pain)  Leukocytosis  Hyperlipidemia  1. Substernal chest pain and associated epigastric pain. Her cardiac enzymes are negative, but her EKG is abnormal. She certainly has risk factors for coronary artery disease. Ms. Lawrence's assessment is noted. She is being treated symptomatically with as needed morphine, as needed nitroglycerin sublingually, and Protonix prophylactically. Her ultrasound reveals fatty liver but no acute gallbladder changes.  Stage I hypertension. Toprol was added yesterday.  Hyperlipidemia, newly diagnosed. Cardiology has already started Crestor.  Tobacco abuse. The patient was advised to stop smoking. She does not want a nicotine patch.   Chronic headaches and chronic low back pain. Analgesics given as needed.  Leukocytosis of unknown significance. She is afebrile.   Plan: Continue current management. Will await Dr. Dietrich Pates assessment. The patient will likely need a stress test or cardiac catheterization.   LOS: 1 day   Shawana Knoch 12/11/2010, 5:14 PM

## 2010-12-11 NOTE — Progress Notes (Signed)
12/11/10 1845 Patient to have possible cardiac cath tomorrow per Dr Marvel Plan note. Spoke with Doug at [facility] this evening, stated would put in request for will-call for tomorrow. Cath lab and Dr Dietrich Pates will notify [facility] of time and they will then let nursing staff know when they will pick patient up. Stated will follow-up again sometime tomorrow.

## 2010-12-12 ENCOUNTER — Inpatient Hospital Stay (HOSPITAL_COMMUNITY)
Admission: AD | Admit: 2010-12-12 | Discharge: 2010-12-13 | DRG: 287 | Disposition: A | Discharge status: Home / Self Care | Payer: Medicaid Other | Source: Other Acute Inpatient Hospital | Attending: Cardiology | Admitting: Cardiology

## 2010-12-12 DIAGNOSIS — I10 Essential (primary) hypertension: Secondary | ICD-10-CM

## 2010-12-12 DIAGNOSIS — R079 Chest pain, unspecified: Principal | ICD-10-CM | POA: Diagnosis present

## 2010-12-12 DIAGNOSIS — D72829 Elevated white blood cell count, unspecified: Secondary | ICD-10-CM | POA: Diagnosis present

## 2010-12-12 LAB — CBC
HCT: 46.6 % — ABNORMAL HIGH (ref 36.0–46.0)
Hemoglobin: 15.6 g/dL — ABNORMAL HIGH (ref 12.0–15.0)
RBC: 4.99 MIL/uL (ref 3.87–5.11)
RDW: 12.4 % (ref 11.5–15.5)
WBC: 13 10*3/uL — ABNORMAL HIGH (ref 4.0–10.5)

## 2010-12-12 LAB — URINE CULTURE: Culture  Setup Time: 201210031652

## 2010-12-12 LAB — PROTIME-INR: INR: 0.99 (ref 0.00–1.49)

## 2010-12-12 MED ORDER — SODIUM CHLORIDE 0.9 % IJ SOLN
INTRAMUSCULAR | Status: AC
  Administered 2010-12-12: 10 mL
  Filled 2010-12-12: qty 10

## 2010-12-12 NOTE — Progress Notes (Signed)
Subjective: No problems overnight.  Denied chest discomfort or dyspnea.  Objective: Vital signs in last 24 hours: Temp:  [97.8 F (36.6 C)-98 F (36.7 C)] 97.8 F (36.6 C) (10/04 0949) Pulse Rate:  [71-73] 73  (10/04 0949) Resp:  [18-20] 18  (10/04 0949) BP: (108-112)/(73-75) 112/75 mmHg (10/04 0949) SpO2:  [95 %-97 %] 95 % (10/04 0949) Weight change:      General-Well developed; no acute distress Neck-No JVD, no carotid bruits Lungs: clear lung fields; normal I:E ratio Cardiovascular-normal PMI; normal S1 and S2; S4 present Abdomen-normal bowel sounds; soft and non-tender without masses or organomegaly Skin-Warm, no significant lesions Extremities-Nl distal pulses; no edema   Lab Results:  Basename 12/12/10 0508 12/11/10 0011  WBC 13.0* 14.0*  HGB 15.6* 15.1*  HCT 46.6* 44.6  PLT 318 320   BMET  Basename 12/11/10 0011 12/10/10 1418  NA 136 138  K 4.1 4.3  CL 101 102  CO2 22 25  GLUCOSE 82 106*  BUN 18 17  CREATININE 0.77 0.70  CALCIUM 9.3 9.8    Studies/Results: [place] of abdomen-probable hepatic steatosis     Assessment/Plan:  Chest Pain-symptoms have abated, either spontaneously or as a result of current therapy. We will proceed with cardiac catheterization for definitive diagnosis to guide long-term therapy.  Patient has been hydrated since 6 AM to reduce the likelihood of renal complications resulting from intravenous contrast  Tobacco abuse-smoking cessation consultation will be requested at [facility]  Hypertension-blood pressure control has been excellent with current medical therapy.  Hyperlipidemia-Lipid profile demonstrates substantial hyperlipidemia. Statin therapy will be initiated.  Mather Bing 12/12/2010, 5:27 PM

## 2010-12-12 NOTE — Progress Notes (Signed)
SUBJECTIVE:NO further chest pain.  Slept well.     Filed Vitals:   12/11/10 0805 12/11/10 1400 12/11/10 1616 12/12/10 0700  BP:  127/76  108/73  Pulse: 70 68 72 71  Temp:  98.8 F (37.1 C)  98 F (36.7 C)  TempSrc:  Oral    Resp: 17 18 18 20   Height:      Weight:      SpO2: 97% 95% 96% 97%    Intake/Output Summary (Last 24 hours) at 12/12/10 0824 Last data filed at 12/12/10 0700  Gross per 24 hour  Intake 641.67 ml  Output    500 ml  Net 141.67 ml    LABS: Basic Metabolic Panel:  Basename 12/11/10 0011 12/10/10 1418  NA 136 138  K 4.1 4.3  CL 101 102  CO2 22 25  GLUCOSE 82 106*  BUN 18 17  CREATININE 0.77 0.70  CALCIUM 9.3 9.8  MG -- --  PHOS -- --   Liver Function Tests:  Basename 12/10/10 1418  AST 14  ALT 15  ALKPHOS 80  BILITOT 0.4  PROT 7.3  ALBUMIN 4.1    Basename 12/10/10 1418  LIPASE 76*  AMYLASE --   CBC:  Basename 12/12/10 0508 12/11/10 0011 12/10/10 1418  WBC 13.0* 14.0* --  NEUTROABS -- -- 8.4*  HGB 15.6* 15.1* --  HCT 46.6* 44.6 --  MCV 93.4 93.1 --  PLT 318 320 --   Cardiac Enzymes:  Basename 12/11/10 0530 12/11/10 0005 12/10/10 1418  CKTOTAL 54 50 62  CKMB 1.5 1.6 1.6  CKMBINDEX -- -- --  TROPONINI <0.30 <0.30 <0.30    Basename 12/11/10 0005  CHOL 262*  HDL 37*  LDLCALC 159*  TRIG 331*  CHOLHDL 7.1  LDLDIRECT --   Thyroid Function Tests:  Basename 12/11/10 1016  TSH 2.280  T4TOTAL --  T3FREE --  THYROIDAB --   Anemia Panel: No results found for this basename: VITAMINB12,FOLATE,FERRITIN,TIBC,IRON,RETICCTPCT in the last 72 hours  RADIOLOGY: Dg Chest 2 View  12/10/2010  *RADIOLOGY REPORT*  Clinical Data: Chest pain  CHEST - 2 VIEW  Comparison: None.  Findings: Normal heart size.  Clear lungs.  Mild bronchitic change. No pneumothorax or pleural fluid.  IMPRESSION: No active cardiopulmonary disease.  Original Report Authenticated By: Donavan Burnet, M.D.   US Abdomen Complete  12/11/2010  *RADIOLOGY REPORT*   Clinical Data:  Epigastric pain  ULTRASOUND ABDOMEN:  Technique:  Sonography of upper abdominal structures was performed.  Comparison:  None  Gallbladder:  Normally distended without stones or wall thickening. No pericholecystic fluid or sonographic Murphy sign.  Common bile duct:  Normal caliber 3 mm diameter  Liver:  Echogenic, likely fatty infiltration, though this can be seen with cirrhosis and certain infiltrative disorders.  Tiny area of slightly decreased echogenicity is seen adjacent the gallbladder fossa, question area of focal sparing.  No definite mass lesion or nodularity.  Hepatopetal portal venous flow.  IVC:  Normal appearance  Pancreas:  Suboptimal visualization due to body habitus; small portions of the body and proximal tail are visualized and normal appearance with remainder obscured.  Spleen:  Normal appearance, 5.4 cm length  Right kidney:  11.8 cm length. Normal morphology without mass or hydronephrosis.  Left kidney:  10.9 cm length. Normal morphology without mass or hydronephrosis.  Aorta:  Normal,  Other:  Are no free fluid  IMPRESSION: Probable fatty infiltration of liver as above with suspected area of focal sparing. Incomplete pancreatic visualization.  Original  Report Authenticated By: Lollie Marrow, M.D.    PHYSICAL EXAM General: Well developed, well nourished, in no acute distress Head: Eyes PERRLA, No xanthomas.   Normal cephalic and atramatic  Lungs: Clear bilaterally to auscultation and percussion. Heart: HRRR S1 S2, with soft S4 murmur.  Pulses are 2+ & equal.            No carotid bruit. No JVD.  No abdominal bruits. No femoral bruits. Abdomen: Bowel sounds are positive, abdomen soft and non-tender without masses or                  Hernia's noted. Msk:  Back normal, normal gait. Normal strength and tone for age. Extremities: No clubbing, cyanosis or edema.  DP +1 Neuro: Alert and oriented X 3. Psych:  Good affect, responds appropriately  TELEMETRY: Reviewed  telemetry pt in NSR:  ASSESSMENT AND PLAN:  Active Problems:  Chest pain  Tobacco abuse  HTN (hypertension)  GERD (gastroesophageal reflux disease)  Positive H. pylori test  Headache  LBP (low back pain)  Leukocytosis  Hyperlipidemia   1. Chest Pain: She has multiple CVRF's smoking, family history, HTN, newly diagnosed hypercholesterolemia. She is on DVT prophylaxis with LMWH, ASA, BB. Will transfer to [facility] for cardiac cath. This has been discussed with the patient, risks and benefits and explanation of procedure. She is willing to proceed.    2. Hypercholesterolemia: Newly diagnosed. TC of 262, HDL 37, LDL 159, Trig 331. Will begin crestor 20 mg daily at HS with dose today.  3. Hypertension: Does not seem to be well controled as an OP with elevation at home, and on arrival in ER,. Currently well controlled as inpt at this time.  4. Tobacco Abuse: Smoking cessation is recommended.  Bettey Mare. Lyman Bishop NP [facility]

## 2010-12-12 NOTE — Progress Notes (Signed)
12/12/10 1030 patient being transferred to [place] for cardiac cath per orders from dr rothbart. Report given to [facility], RN with [facility] this morning. Patient denies chest pain or discomfort this morning. Patient left floor in stable condition via [facility] transport this am about 1020. [facility] stated patient may be going to dept 3700 first, called report to Shanda Bumps, RN receiving nurse on 3700 this am at 1030.

## 2010-12-13 LAB — CBC
HCT: 41.9 % (ref 36.0–46.0)
HCT: 43.6 % (ref 36.0–46.0)
MCV: 90.6 fL (ref 78.0–100.0)
Platelets: 316 10*3/uL (ref 150–400)
RBC: 4.81 MIL/uL (ref 3.87–5.11)
RDW: 12.4 % (ref 11.5–15.5)
WBC: 14.2 10*3/uL — ABNORMAL HIGH (ref 4.0–10.5)
WBC: 14.4 10*3/uL — ABNORMAL HIGH (ref 4.0–10.5)

## 2010-12-13 LAB — DIFFERENTIAL
Basophils Absolute: 0.1 10*3/uL (ref 0.0–0.1)
Basophils Relative: 0 % (ref 0–1)
Eosinophils Absolute: 0.3 10*3/uL (ref 0.0–0.7)
Eosinophils Relative: 2 % (ref 0–5)
Lymphocytes Relative: 25 % (ref 12–46)
Lymphs Abs: 3.6 10*3/uL (ref 0.7–4.0)
Monocytes Absolute: 1 10*3/uL (ref 0.1–1.0)
Monocytes Relative: 7 % (ref 3–12)
Neutro Abs: 9.5 10*3/uL — ABNORMAL HIGH (ref 1.7–7.7)
Neutrophils Relative %: 66 % (ref 43–77)

## 2010-12-13 LAB — BASIC METABOLIC PANEL
BUN: 17 mg/dL (ref 6–23)
CO2: 25 mEq/L (ref 19–32)
Calcium: 9.2 mg/dL (ref 8.4–10.5)
Chloride: 104 mEq/L (ref 96–112)
Creatinine, Ser: 0.69 mg/dL (ref 0.50–1.10)
GFR calc Af Amer: >90 mL/min (ref >90)
GFR calc non Af Amer: >90 mL/min (ref >90)
Glucose, Bld: 100 mg/dL — ABNORMAL HIGH (ref 70–99)
Potassium: 3.9 mEq/L (ref 3.5–5.1)
Sodium: 139 mEq/L (ref 135–145)

## 2010-12-13 NOTE — Cardiovascular Report (Signed)
Sarah Vargas, Sarah Vargas NO.:  [id number]  MEDICAL RECORD NO.:  [id number]  LOCATION:  3707                         FACILITY:  [facility]  PHYSICIAN:  Verne Carrow, MDDATE OF BIRTH:  [birth year]  DATE OF PROCEDURE:  12/12/2010 DATE OF DISCHARGE:                           CARDIAC CATHETERIZATION   PRIMARY CARDIOLOGIST:  Gerrit Friends. Dietrich Pates, MD, [facility]  PROCEDURES PERFORMED: 1. Left heart catheterization. 2. Selective coronary angiography. 3. Left ventricular angiogram.  OPERATOR:  Verne Carrow, MD  INDICATIONS:  This is a 50 year old Caucasian female with a past medical history significant for fibromyalgia, osteoarthritis, chronic back pain, ongoing tobacco abuse, hypertension, and hyperlipidemia who was admitted to [facility] with complaints of chest discomfort.  Her cardiac enzymes were negative.  The patient was seen in consultation by Dr. [place] Bing and by Joni Reining, nurse practitioner who reported at the [facility] Cardiology consult team at [facility].  It was felt that the patient was high risk for obstructive coronary artery disease given her risk factors.  She was transferred to [facility] for cardiac catheterization today.  PROCEDURE IN DETAIL:  The patient was brought to the Main Cardiac Catheterization Laboratory after signing informed consent for the procedure.  The right groin was prepped and draped in a sterile fashion. Lidocaine 1% was used for local anesthesia.  A 5-French sheath was inserted into the right femoral artery without difficulty.  Standard diagnostic catheters were used to perform selective coronary angiography.  A pigtail catheter was used to perform a left ventricular angiogram.  The patient tolerated the procedure well.  There were no immediate complications.  The patient was taken to the recovery area in stable condition.  HEMODYNAMIC FINDINGS:  Central aortic pressure 140/85.   Left ventricular pressure 141/4/7.  ANGIOGRAPHIC FINDINGS: 1. The left main coronary artery was a long segment and bifurcated     into the circumflex artery and the left anterior descending artery. 2. The circumflex artery is very small in caliber and had no disease. 3. The left anterior descending artery is a moderate-sized vessel that     does not quite reach the apex.  There is mild 20% plaque in the     ostium.  The mid vessel has mild luminal irregularities.  There is     a diagonal branch that is patent with mild plaque disease. 4. The right coronary artery is a large dominant vessel with mild     luminal irregularities in the proximal and mid segment, but no     focally obstructive lesions. 5. Left ventricular angiogram was performed in the RAO projection and     showed hyperdynamic left ventricular systolic function with     ejection fraction of 70%.  IMPRESSION: 1. Mild nonobstructive coronary artery disease. 2. Normal left ventricular systolic function.  RECOMMENDATIONS:  I do not recommend any further ischemic workup at this time.  I will continue aspirin and statin given the patient's mild coronary artery disease.  If the patient continues to have chest pain, then other reasons for chest pain will have to be considered including GI causes.     Verne Carrow, MD     CM/MEDQ  D:  12/12/2010  T:  12/13/2010  Job:  [id number]  cc:   Gerrit Friends. Dietrich Pates, MD, [facility] Bettey Mare. Lyman Bishop, NP  Electronically Signed by Verne Carrow MD on 12/13/2010 09:37:50 AM

## 2010-12-19 NOTE — Discharge Summary (Signed)
Sarah Vargas, Sarah Vargas NO.:  [id number]  MEDICAL RECORD NO.:  [id number]  LOCATION:  3707                         FACILITY:  [facility]  PHYSICIAN:  Pricilla Riffle, MD, FACCDATE OF BIRTH:  01-13-1961  DATE OF ADMISSION:  12/12/2010 DATE OF DISCHARGE:  12/13/2010                              DISCHARGE SUMMARY   PRIMARY CARDIOLOGIST:  Gerrit Friends. Dietrich Pates, MD, [facility]  PRIMARY CARE PROVIDER:  [facility] Department.  DISCHARGE DIAGNOSIS:  Chest pain without objective evidence of ischemia.  SECONDARY DIAGNOSES: 1. History of back pain. 2. Hypertension. 3. Hyperlipidemia. 4. Fibromyalgia. 5. Tobacco abuse. 6. History of gastroesophageal reflux disease with Helicobacter pylori     treated in 2011. 7. Low back pain. 8. Nephrolithiasis status post ureteral stents. 9. Generalized headaches.  ALLERGIES:  No known drug allergies.  PROCEDURE:  Left heart catheterization performed on December 12, 2010, revealing an EF of 70% with minor luminal irregularities in the LAD and right coronary artery.  No obstructive disease.  Medical therapy recommended.  HISTORY OF PRESENT ILLNESS:  This is a 50 year old female without prior cardiac history who was admitted to [facility] on December 10, 2010, secondary to an episode of sharp chest pain.  There, ECG showed nonspecific changes while point-of-care markers were negative.  Because of risk factors concerning for possible ischemia, decision was made to transfer to [facility] for further evaluation.  HOSPITAL COURSE:  The patient arrived at [facility] on December 12, 2010, and underwent diagnostic catheterization revealing minor luminal irregularities throughout the LAD and right coronary artery but otherwise no significant stenosis.  EF was 70%.  Continued medical therapy was recommended.  In the setting of minor luminal irregularities and an LDL cholesterol of 159, we have placed her on statin therapy. She has  further been counseled on the importance of smoking cessation.  The patient has been noted to have leukocytosis during this admission with a WBC 12.5 on admission to [facility] on December 10, 2010, and that remains elevated at 14.2 today.  She had an urinalysis performed on admission to [facility] on December 10, 2010, which was negative while her urine culture showed no growth.  She had not been febrile and her chest x-ray on December 10, 2010 showed no active cardiopulmonary disease.  We have repeated a CBC with differential as well as a sed rate and UA prior to discharge.  Those results are currently pending and can be followed up in the outpatient setting.  The patient has no current complaints and will be discharged home today in good condition.  DISCHARGE LABORATORIES:  Hemoglobin 14.4, hematocrit 41.9, WBC 14.2, platelets 316.  INR 0.99.  Sodium 139, potassium 3.9, chloride 104, CO2 25, BUN 17, creatinine 0.69, glucose 100.  Total bilirubin 0.4, alkaline phosphatase 80, AST 14, ALT 15, total protein 7.3, albumin 4.1, calcium 9.2.  Lipase 76.  CK 54, MB 1.5, troponin-I less than 0.30.  Total cholesterol 262, triglycerides 331, HDL 37, LDL 159.  TSH 2.280, free T4 1.32.  Urinalysis negative.  Urine culture showed no growth.  Abdominal ultrasound showed probable fatty infiltration of the liver with suspected area of focal sparing.  Incomplete pancreatic visualization.  DISPOSITION:  The patient will be discharged home today in good condition.  FOLLOWUP PLANS AND APPOINTMENTS:  The patient will follow up in the [facility] Department for further management.  DISCHARGE MEDICATIONS: 1. Lipitor 20 mg at bedtime. 2. Metoprolol 25 mg b.i.d. 3. Aspirin 81 mg daily. 4. Cyclobenzaprine 10 mg daily.  OUTSTANDING LABORATORY STUDIES:  CBC with diff and sedimentation rate are pending.  DURATION OF DISCHARGE ENCOUNTER:  45 minutes including physician time.     Nicolasa Ducking, ANP   ______________________________ Pricilla Riffle, MD, [facility]    CB/MEDQ  D:  12/13/2010  T:  12/14/2010  Job:  [id number]  cc:   [facility] Department  Electronically Signed by Nicolasa Ducking ANP on 12/18/2010 07:14:17 PM Electronically Signed by Dietrich Pates MD [facility] on 12/19/2010 10:22:37 AM

## 2013-01-11 IMAGING — CR DG CHEST 2V
2 series · 2 of 2 positions shown · non-contrast
Comparison: None.

CLINICAL DATA: Chest pain

CHEST - 2 VIEW

[view not recorded (1 of 2)]
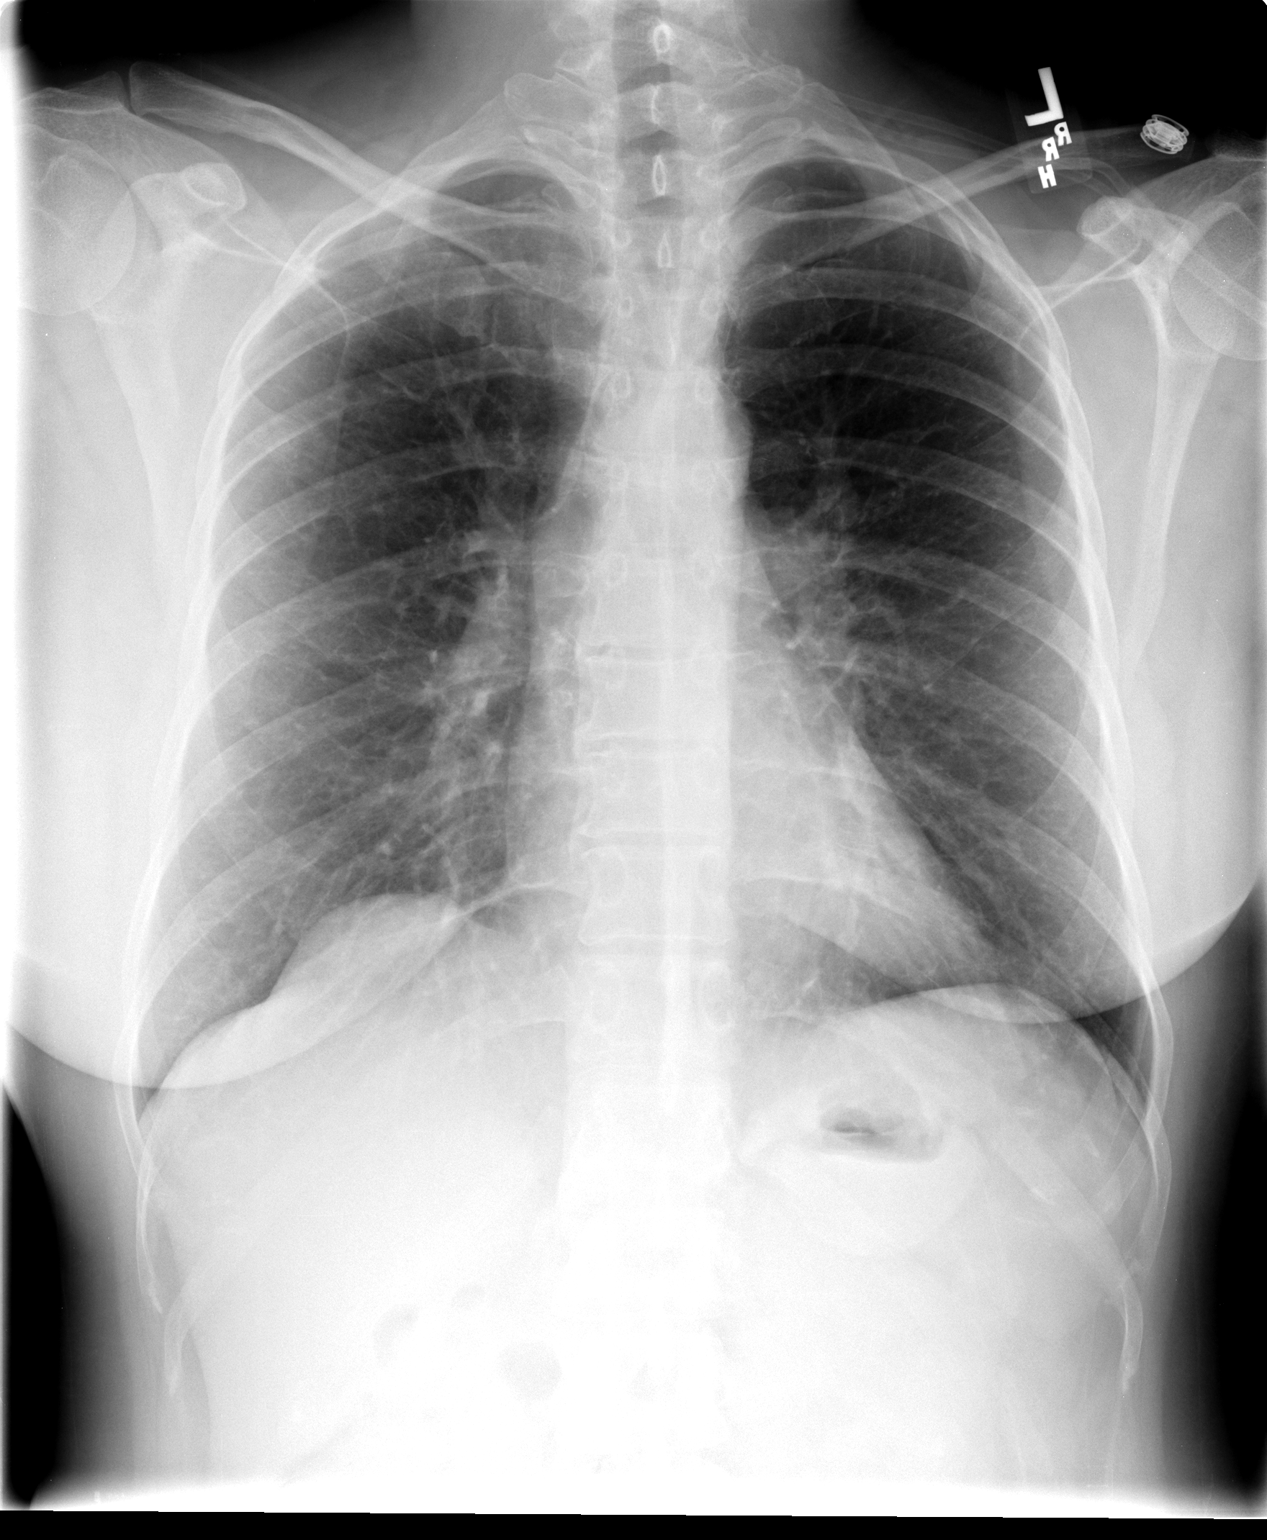

[view not recorded (2 of 2)]
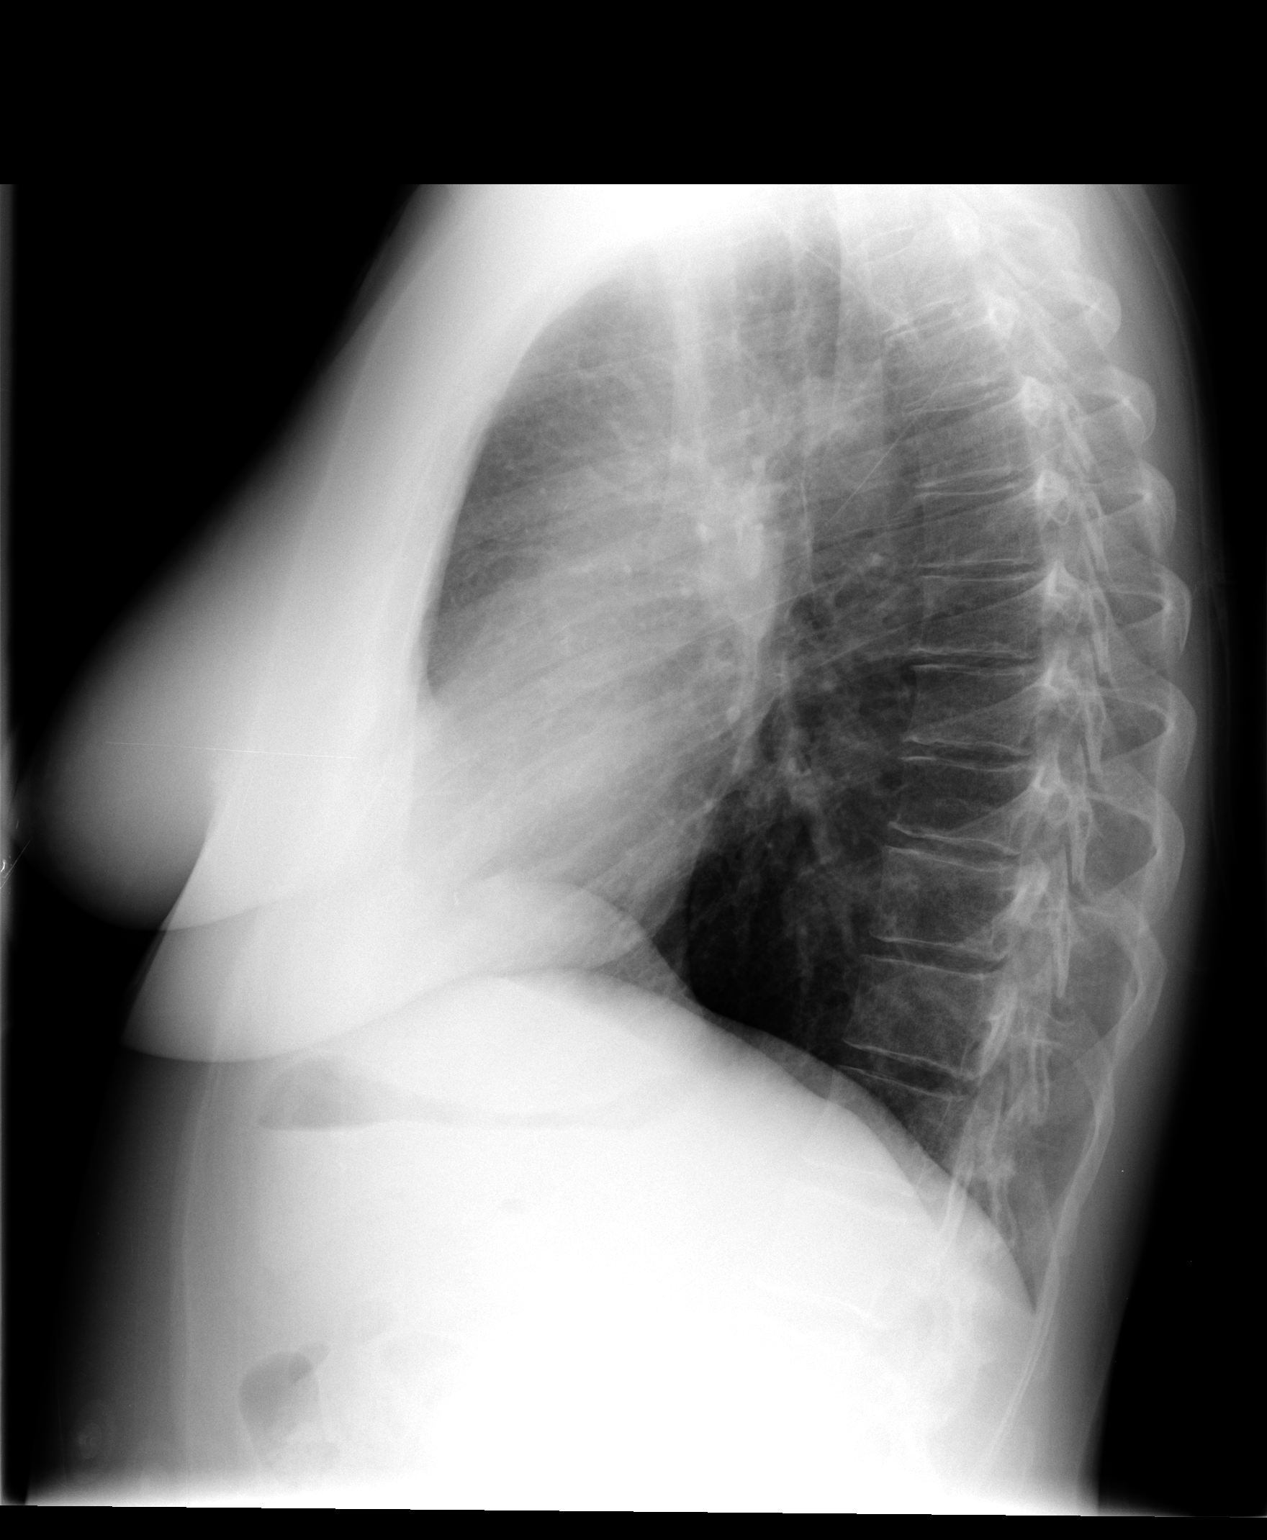

[2 of 2 positions shown; findings below may reference images not displayed]

FINDINGS: Normal heart size.  Clear lungs.  Mild bronchitic change.
No pneumothorax or pleural fluid.
IMPRESSION: No active cardiopulmonary disease.

## 2013-01-12 IMAGING — US US ABDOMEN COMPLETE
1 series · 13 of 25 positions shown · non-contrast
Comparison: None

CLINICAL DATA: Epigastric pain

ULTRASOUND ABDOMEN:
TECHNIQUE: Sonography of upper abdominal structures was performed.

[Series 1: us abdomen complete · 0.28mm/px · 13 of 65 slices shown]
[im 1/65]
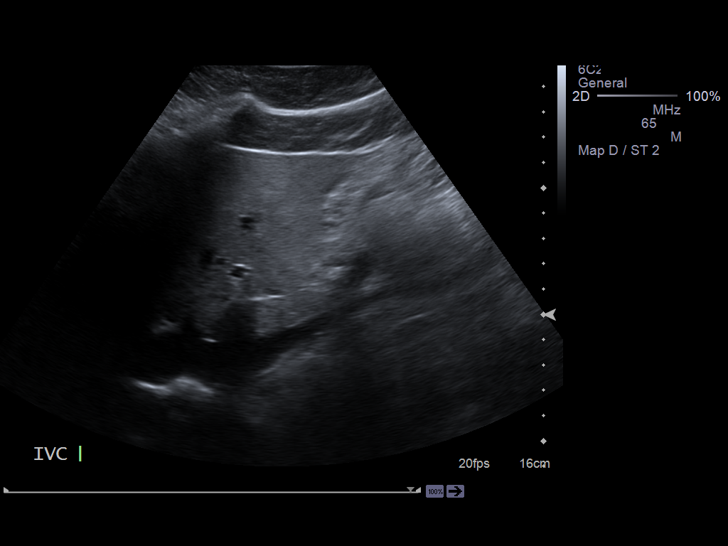
[im 6/65]
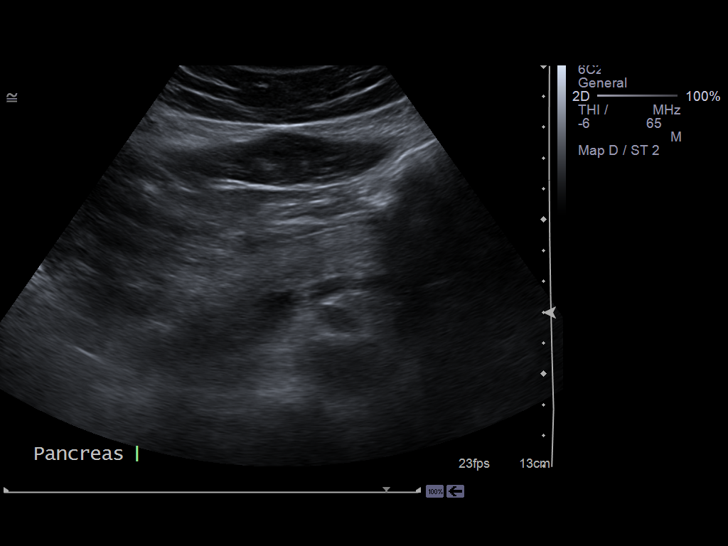
[im 11/65]
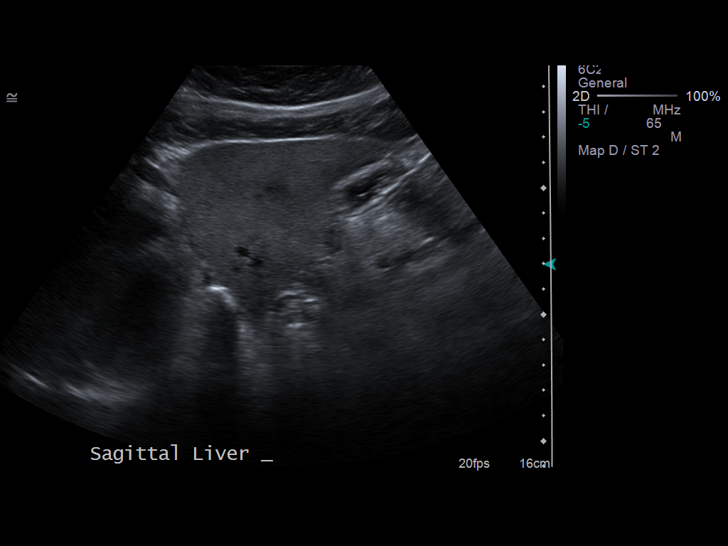
[im 17/65]
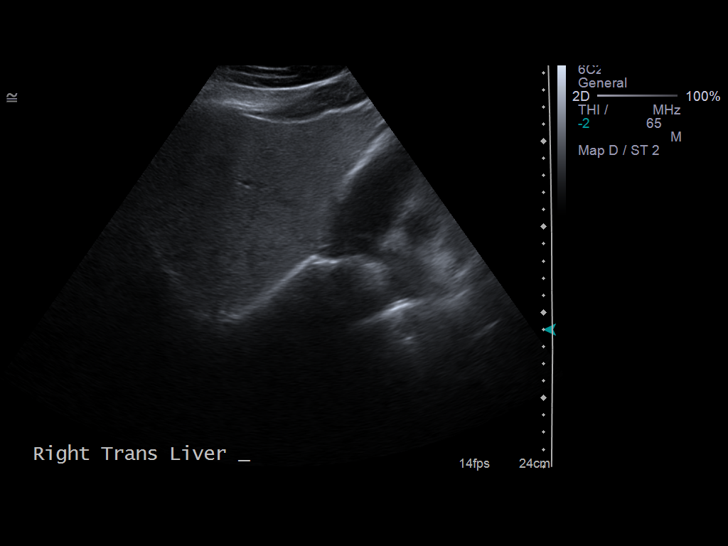
[im 22/65]
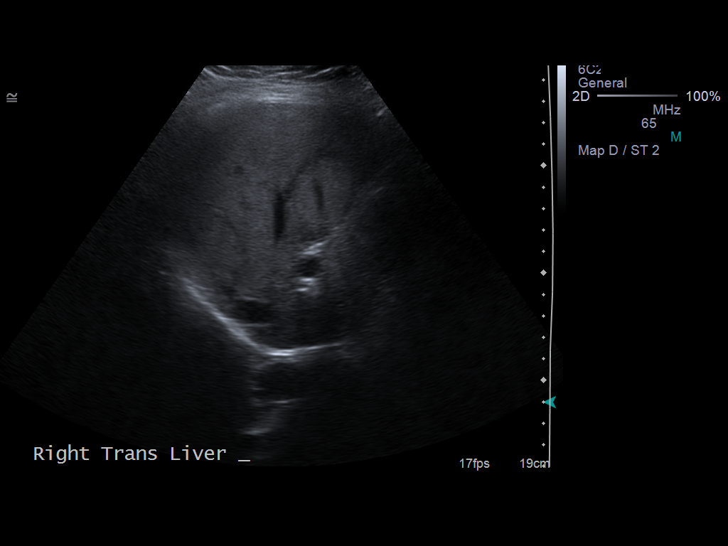
[im 27/65]
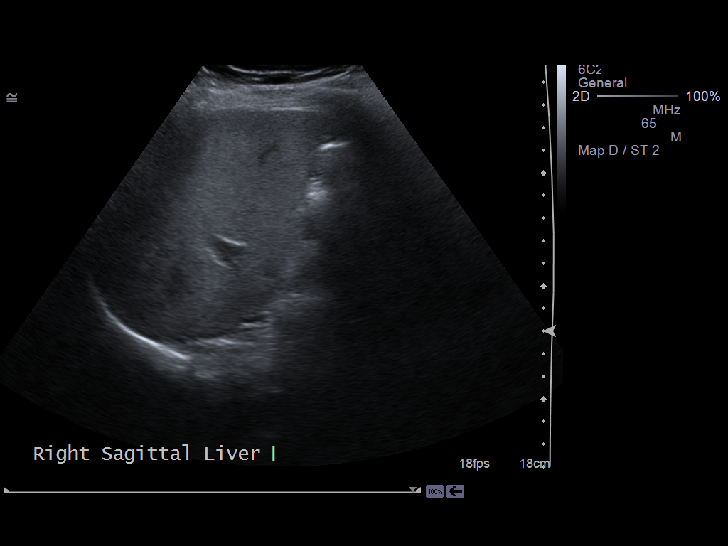
[im 33/65]
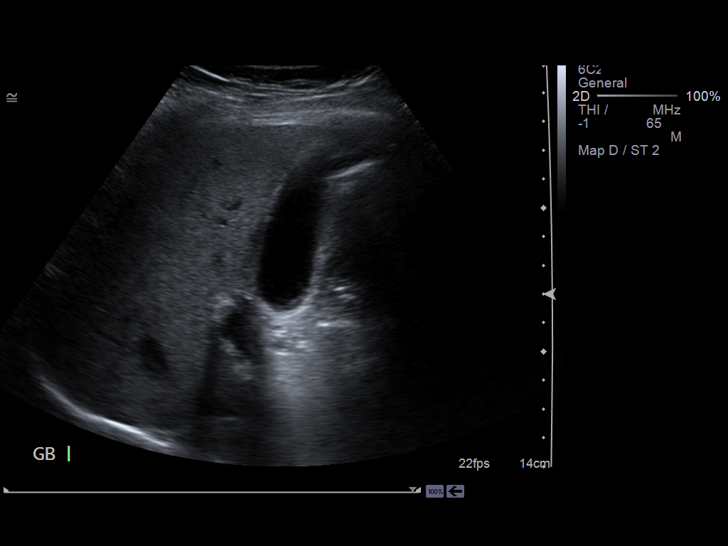
[im 38/65]
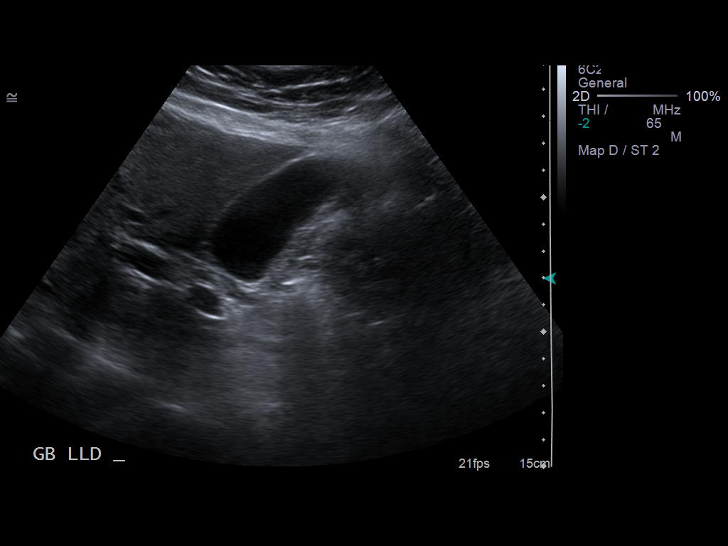
[im 43/65]
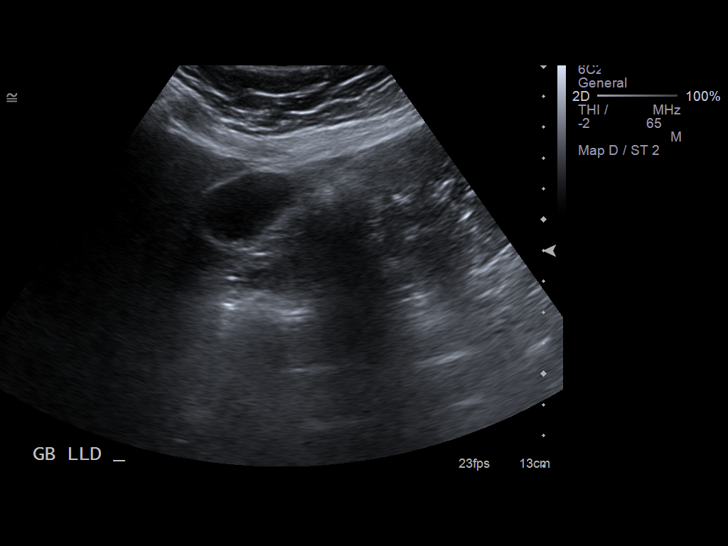
[im 49/65]
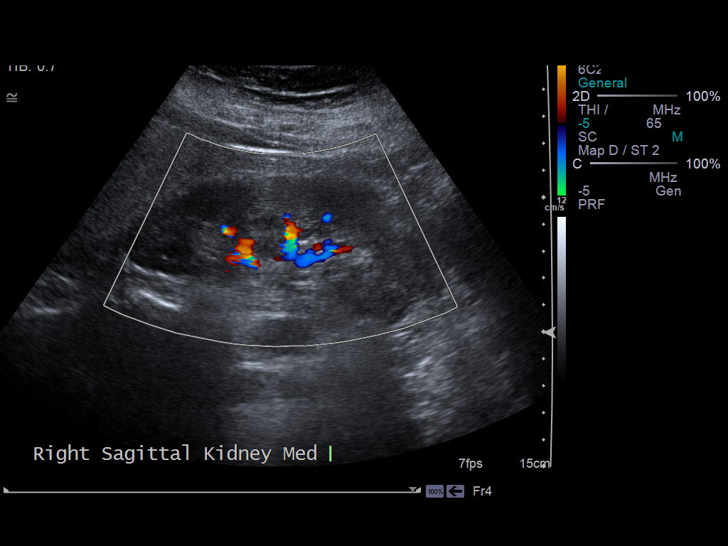
[im 54/65]
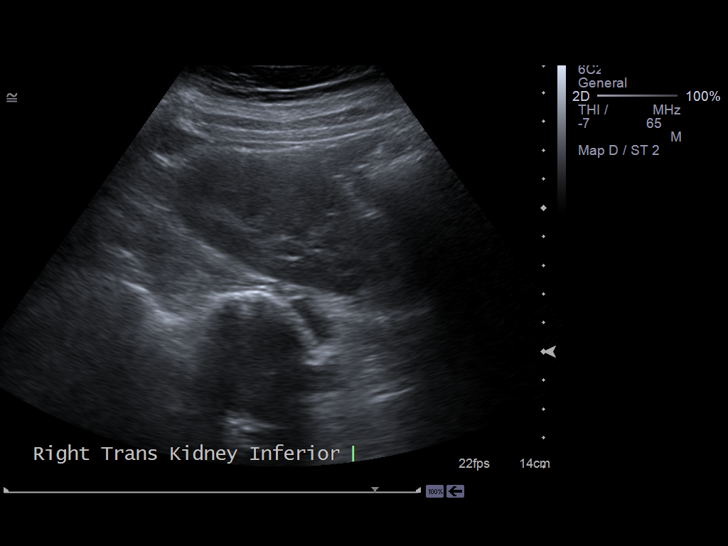
[im 59/65]
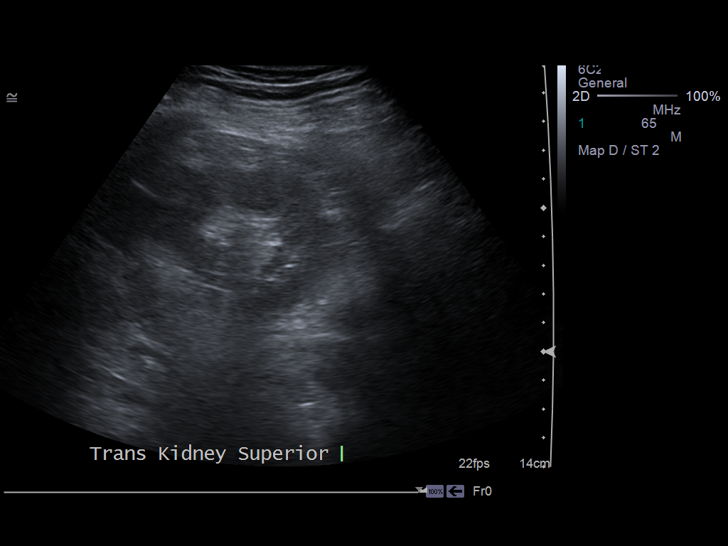
[im 65/65]
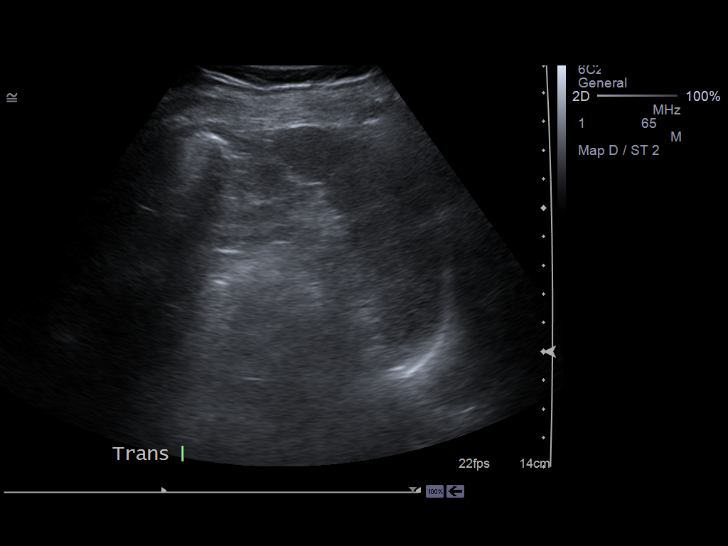

[13 of 25 positions shown; findings below may reference images not displayed]

Gallbladder:  Normally distended without stones or wall thickening.
No pericholecystic fluid or sonographic Murphy sign.

Common bile duct:  Normal caliber 3 mm diameter

Liver:  Echogenic, likely fatty infiltration, though this can be
seen with cirrhosis and certain infiltrative disorders.  Tiny area
of slightly decreased echogenicity is seen adjacent the gallbladder
fossa, question area of focal sparing.  No definite mass lesion or
nodularity.  Hepatopetal portal venous flow.

IVC:  Normal appearance

Pancreas:  Suboptimal visualization due to body habitus; small
portions of the body and proximal tail are visualized and normal
appearance with remainder obscured.

Spleen:  Normal appearance, 5.4 cm length

Right kidney:  11.8 cm length. Normal morphology without mass or
hydronephrosis.

Left kidney:  10.9 cm length. Normal morphology without mass or
hydronephrosis.

Aorta:  Normal,

Other:  Are no free fluid
IMPRESSION: Probable fatty infiltration of liver as above with suspected area
of focal sparing.
Incomplete pancreatic visualization.

## 2014-08-29 DIAGNOSIS — F411 Generalized anxiety disorder: Secondary | ICD-10-CM | POA: Insufficient documentation

## 2015-02-08 DIAGNOSIS — M4716 Other spondylosis with myelopathy, lumbar region: Secondary | ICD-10-CM | POA: Insufficient documentation

## 2016-05-09 DIAGNOSIS — M19019 Primary osteoarthritis, unspecified shoulder: Secondary | ICD-10-CM | POA: Insufficient documentation

## 2016-11-03 DIAGNOSIS — G9332 Myalgic encephalomyelitis/chronic fatigue syndrome: Secondary | ICD-10-CM | POA: Insufficient documentation

## 2019-10-19 ENCOUNTER — Encounter: Payer: Self-pay | Admitting: Internal Medicine

## 2019-11-23 ENCOUNTER — Other Ambulatory Visit: Payer: Self-pay

## 2019-11-23 ENCOUNTER — Ambulatory Visit: Payer: Medicaid Other | Admitting: Internal Medicine

## 2019-11-23 ENCOUNTER — Encounter: Payer: Self-pay | Admitting: Internal Medicine

## 2019-11-23 VITALS — BP 115/77 | HR 66 | Temp 97.1°F | Ht 65.0 in | Wt 135.0 lb

## 2019-11-23 DIAGNOSIS — R110 Nausea: Secondary | ICD-10-CM | POA: Diagnosis not present

## 2019-11-23 DIAGNOSIS — K219 Gastro-esophageal reflux disease without esophagitis: Secondary | ICD-10-CM

## 2019-11-23 DIAGNOSIS — R1032 Left lower quadrant pain: Secondary | ICD-10-CM | POA: Insufficient documentation

## 2019-11-23 NOTE — Progress Notes (Signed)
Primary Care Physician:  Rebecka Apley, NP Primary Gastroenterologist:  Dr. Marletta Lor  Chief Complaint  Patient presents with  . Nausea    none currently but did have x 2-3 months after having surgery for bladder prolapse and had uterus removed in May 2021.   . Diarrhea    not currently but happens off/on   . Abdominal Pain    "weird feeling" LLQ, uncomfortable "slight pain"    HPI:   Sarah Vargas is a 59 y.o. female who presents to the clinic today by referral from her PCP Sharon Seller for evaluation.  She has multiple complaints for me today.  She states after undergoing surgical repair of a prolapsed bladder/uterus a few months ago she began having nausea.  She took Zofran and Phenergan for this and it has gradually improved.  Also notes chronic reflux which is relatively well controlled on omeprazole 20 mg daily.  Denies any dysphagia.  Also notes chronic left lower quadrant pain.  States this is accompanied by intermittent diarrhea.  Also notes green stools at times.  No hematochezia or melena.  No unintentional weight loss.  No family history of colorectal malignancy.  No previous colonoscopy.  Otherwise she has no other complaints.   Past Medical History:  Diagnosis Date  . Back pain   . Chest pain    12/2010.  . Fibromyalgia   . Generalized headaches   . GERD (gastroesophageal reflux disease) 12/10/2010  . HTN (hypertension) 12/10/2010  . Kidney stones    s/p ureteral stents-removed in past.  . LBP (low back pain) 12/10/2010  . Osteoarthritis   . Positive H. pylori test 12/10/2010   Treated 2011.  . Tobacco abuse 12/10/2010    Past Surgical History:  Procedure Laterality Date  . SALPINGOOPHORECTOMY     For hx of tubal pregnancy  . TUBAL LIGATION    . URETERAL STENT PLACEMENT      Current Outpatient Medications  Medication Sig Dispense Refill  . acyclovir (ZOVIRAX) 400 MG tablet Take 400 mg by mouth as needed.    Marland Kitchen aspirin EC 81 MG tablet Take 81 mg by  mouth daily. Swallow whole.    . baclofen (LIORESAL) 10 MG tablet Take 10 mg by mouth 3 (three) times daily.    . Calcium Carb-Cholecalciferol (CALCIUM 600 + D PO) Take 1 tablet by mouth in the morning and at bedtime.    . celecoxib (CELEBREX) 200 MG capsule Take 200 mg by mouth daily.    . clobetasol cream (TEMOVATE) 0.05 % Apply 1 application topically as needed.    . DULOXETINE HCL PO Take 90 mg by mouth daily.    Marland Kitchen ibuprofen (ADVIL,MOTRIN) 200 MG tablet Take 200 mg by mouth every 6 (six) hours as needed. For pain     . loratadine (CLARITIN) 10 MG tablet Take 10 mg by mouth daily.    . metFORMIN (GLUCOPHAGE) 500 MG tablet Take 500 mg by mouth daily with breakfast.    . metoprolol succinate (TOPROL-XL) 25 MG 24 hr tablet Take 25 mg by mouth in the morning and at bedtime.    . Multiple Vitamin (MULTIVITAMIN) tablet Take 1 tablet by mouth daily.    Marland Kitchen omeprazole (PRILOSEC) 20 MG capsule Take 20 mg by mouth daily.    Marland Kitchen oxybutynin (DITROPAN) 5 MG tablet Take 5 mg by mouth daily.    . pravastatin (PRAVACHOL) 80 MG tablet Take 80 mg by mouth daily.    Marland Kitchen topiramate (TOPAMAX) 50  MG tablet Take 50 mg by mouth 2 (two) times daily.    . traZODone (DESYREL) 150 MG tablet Take 150 mg by mouth at bedtime.     No current facility-administered medications for this visit.    Allergies as of 11/23/2019  . (No Known Allergies)    Family History  Problem Relation Age of Onset  . Heart attack Father   . Hypertension Father   . Heart attack Mother   . Hypertension Mother     Social History   Socioeconomic History  . Marital status: Single    Spouse name: Not on file  . Number of children: Not on file  . Years of education: Not on file  . Highest education level: Not on file  Occupational History    Comment: Unemployed  Tobacco Use  . Smoking status: Current Every Day Smoker    Packs/day: 0.50    Years: 30.00    Pack years: 15.00  . Smokeless tobacco: Never Used  Substance and Sexual  Activity  . Alcohol use: Yes    Comment: occas  . Drug use: No  . Sexual activity: Not on file  Other Topics Concern  . Not on file  Social History Narrative   Lives in [place]   Unemployed   Divorced   Social Determinants of Health   Financial Resource Strain:   . Difficulty of Paying Living Expenses: Not on file  Food Insecurity:   . Worried About Programme researcher, broadcasting/film/video in the Last Year: Not on file  . Ran Out of Food in the Last Year: Not on file  Transportation Needs:   . Lack of Transportation (Medical): Not on file  . Lack of Transportation (Non-Medical): Not on file  Physical Activity:   . Days of Exercise per Week: Not on file  . Minutes of Exercise per Session: Not on file  Stress:   . Feeling of Stress : Not on file  Social Connections:   . Frequency of Communication with Friends and Family: Not on file  . Frequency of Social Gatherings with Friends and Family: Not on file  . Attends Religious Services: Not on file  . Active Member of Clubs or Organizations: Not on file  . Attends Banker Meetings: Not on file  . Marital Status: Not on file  Intimate Partner Violence:   . Fear of Current or Ex-Partner: Not on file  . Emotionally Abused: Not on file  . Physically Abused: Not on file  . Sexually Abused: Not on file    Subjective: Review of Systems  Constitutional: Negative for chills and fever.  HENT: Negative for congestion and hearing loss.   Eyes: Negative for blurred vision and double vision.  Respiratory: Negative for cough and shortness of breath.   Cardiovascular: Negative for chest pain and palpitations.  Gastrointestinal: Positive for heartburn, nausea and vomiting. Negative for abdominal pain, blood in stool, constipation, diarrhea and melena.  Genitourinary: Negative for dysuria and urgency.  Musculoskeletal: Negative for joint pain and myalgias.  Skin: Negative for itching and rash.  Neurological: Negative for dizziness and  headaches.  Psychiatric/Behavioral: Negative for depression. The patient is not nervous/anxious.        Objective: BP 115/77   Pulse 66   Temp (!) 97.1 F (36.2 C) (Oral)   Ht 5\' 5"  (1.651 m)   Wt 135 lb (61.2 kg)   BMI 22.47 kg/m  Physical Exam Constitutional:      Appearance: Normal appearance.  HENT:     Head: Normocephalic and atraumatic.  Eyes:     Extraocular Movements: Extraocular movements intact.     Conjunctiva/sclera: Conjunctivae normal.  Cardiovascular:     Rate and Rhythm: Normal rate and regular rhythm.  Pulmonary:     Effort: Pulmonary effort is normal.     Breath sounds: Normal breath sounds.  Abdominal:     General: Bowel sounds are normal.     Palpations: Abdomen is soft.  Musculoskeletal:        General: No swelling. Normal range of motion.     Cervical back: Normal range of motion and neck supple.  Skin:    General: Skin is warm and dry.     Coloration: Skin is not jaundiced.  Neurological:     General: No focal deficit present.     Mental Status: She is alert and oriented to person, place, and time.  Psychiatric:        Mood and Affect: Mood normal.        Behavior: Behavior normal.      Assessment: *Chronic reflux-well-controlled on omeprazole *Nausea without vomiting-improved currently *Diarrhea-intermittent *Left lower quadrant abdominal pain  Plan: Etiology of patient's left lower quadrant pain and intermittent diarrhea unclear.  She is 54 and has never undergone colonoscopy in the past.  I highly recommended that we schedule her for one today.  She states she is visiting her son in [place] soon and would like to wait till she gets back to have this done.  I counseled her that all she needs to do is call our office to schedule when she returns.  I highly recommended that we perform this not only for her pain but also as a colon cancer screening modality.  She understands.  Continue on Omeprazole 20 mg daily for reflux.   Her nausea  appears to be improved.  Discussed that if her nausea returns or her reflux becomes more uncontrolled, we may consider performing EGD at the same time as colonoscopy.  Counseled patient on tobacco cessation and recommended use of nicotine patches, gums, lozenges, etc.  She states she will work on this.  Currently smoking 1/2 pack/day.  Thank you Sharon Seller for the kind referral.  11/23/2019 8:48 AM   Disclaimer: This note was dictated with voice recognition software. Similar sounding words can inadvertently be transcribed and may not be corrected upon review.

## 2019-11-23 NOTE — Patient Instructions (Signed)
I would recommend that we schedule colonoscopy for both screening purposes as well as to evaluate your abdominal pain.  Please call the office when you return from [place] seeing your grandbaby and we will schedule this for you.  If your nausea returns or you have worsening GERD/reflux we may consider performing an upper endoscopy at the same time.  Continue on daily omeprazole.  I recommend that you work on quitting smoking as well.  At [facility] we value your feedback. You may receive a survey about your visit today. Please share your experience as we strive to create trusting relationships with our patients to provide genuine, compassionate, quality care.  We appreciate your understanding and patience as we review any laboratory studies, imaging, and other diagnostic tests that are ordered as we care for you. Our office policy is 5 business days for review of these results, and any emergent or urgent results are addressed in a timely manner for your best interest. If you do not hear from our office in 1 week, please contact us.   We also encourage the use of MyChart, which contains your medical information for your review as well. If you are not enrolled in this feature, an access code is on this after visit summary for your convenience. Thank you for allowing [place] to be involved in your care.  It was great to see you today!  I hope you have a great rest of your summer!!

## 2020-05-25 DIAGNOSIS — M7501 Adhesive capsulitis of right shoulder: Secondary | ICD-10-CM | POA: Insufficient documentation

## 2022-09-30 DIAGNOSIS — M47814 Spondylosis without myelopathy or radiculopathy, thoracic region: Secondary | ICD-10-CM | POA: Insufficient documentation

## 2022-12-02 ENCOUNTER — Encounter (HOSPITAL_COMMUNITY)
Admission: EM | Disposition: A | Discharge status: Home / Self Care | Payer: Self-pay | Source: Home / Self Care | Attending: Nurse Practitioner

## 2022-12-02 ENCOUNTER — Inpatient Hospital Stay (HOSPITAL_COMMUNITY)
Admission: EM | Admit: 2022-12-02 | Discharge: 2022-12-03 | DRG: 322 | Disposition: A | Discharge status: Home / Self Care | Payer: Medicaid Other | Attending: Nurse Practitioner | Admitting: Nurse Practitioner

## 2022-12-02 ENCOUNTER — Emergency Department (HOSPITAL_COMMUNITY): Admit: 2022-12-02 | Payer: Medicaid Other | Admitting: Cardiovascular Disease

## 2022-12-02 ENCOUNTER — Encounter (HOSPITAL_COMMUNITY): Payer: Self-pay | Admitting: Internal Medicine

## 2022-12-02 DIAGNOSIS — Z560 Unemployment, unspecified: Secondary | ICD-10-CM

## 2022-12-02 DIAGNOSIS — I2510 Atherosclerotic heart disease of native coronary artery without angina pectoris: Secondary | ICD-10-CM

## 2022-12-02 DIAGNOSIS — I213 ST elevation (STEMI) myocardial infarction of unspecified site: Principal | ICD-10-CM | POA: Diagnosis present

## 2022-12-02 DIAGNOSIS — K219 Gastro-esophageal reflux disease without esophagitis: Secondary | ICD-10-CM | POA: Diagnosis present

## 2022-12-02 DIAGNOSIS — Z5986 Financial insecurity: Secondary | ICD-10-CM | POA: Diagnosis not present

## 2022-12-02 DIAGNOSIS — Z5982 Transportation insecurity: Secondary | ICD-10-CM | POA: Diagnosis not present

## 2022-12-02 DIAGNOSIS — I2109 ST elevation (STEMI) myocardial infarction involving other coronary artery of anterior wall: Secondary | ICD-10-CM | POA: Diagnosis not present

## 2022-12-02 DIAGNOSIS — I2102 ST elevation (STEMI) myocardial infarction involving left anterior descending coronary artery: Secondary | ICD-10-CM

## 2022-12-02 DIAGNOSIS — R079 Chest pain, unspecified: Secondary | ICD-10-CM | POA: Diagnosis not present

## 2022-12-02 DIAGNOSIS — Z960 Presence of urogenital implants: Secondary | ICD-10-CM | POA: Diagnosis present

## 2022-12-02 DIAGNOSIS — E785 Hyperlipidemia, unspecified: Secondary | ICD-10-CM | POA: Diagnosis present

## 2022-12-02 DIAGNOSIS — Z79899 Other long term (current) drug therapy: Secondary | ICD-10-CM | POA: Diagnosis not present

## 2022-12-02 DIAGNOSIS — Z9079 Acquired absence of other genital organ(s): Secondary | ICD-10-CM

## 2022-12-02 DIAGNOSIS — Z955 Presence of coronary angioplasty implant and graft: Secondary | ICD-10-CM

## 2022-12-02 DIAGNOSIS — Z7984 Long term (current) use of oral hypoglycemic drugs: Secondary | ICD-10-CM | POA: Diagnosis not present

## 2022-12-02 DIAGNOSIS — Z8249 Family history of ischemic heart disease and other diseases of the circulatory system: Secondary | ICD-10-CM | POA: Diagnosis not present

## 2022-12-02 DIAGNOSIS — Z90722 Acquired absence of ovaries, bilateral: Secondary | ICD-10-CM

## 2022-12-02 DIAGNOSIS — Z7982 Long term (current) use of aspirin: Secondary | ICD-10-CM

## 2022-12-02 DIAGNOSIS — I252 Old myocardial infarction: Secondary | ICD-10-CM | POA: Diagnosis not present

## 2022-12-02 DIAGNOSIS — F17210 Nicotine dependence, cigarettes, uncomplicated: Secondary | ICD-10-CM | POA: Diagnosis present

## 2022-12-02 DIAGNOSIS — I10 Essential (primary) hypertension: Secondary | ICD-10-CM | POA: Diagnosis present

## 2022-12-02 DIAGNOSIS — Z716 Tobacco abuse counseling: Secondary | ICD-10-CM

## 2022-12-02 DIAGNOSIS — M797 Fibromyalgia: Secondary | ICD-10-CM | POA: Diagnosis present

## 2022-12-02 DIAGNOSIS — I2129 ST elevation (STEMI) myocardial infarction involving other sites: Secondary | ICD-10-CM | POA: Diagnosis present

## 2022-12-02 HISTORY — PX: CORONARY/GRAFT ACUTE MI REVASCULARIZATION: CATH118305

## 2022-12-02 HISTORY — PX: LEFT HEART CATH AND CORONARY ANGIOGRAPHY: CATH118249

## 2022-12-02 HISTORY — DX: ST elevation (STEMI) myocardial infarction of unspecified site: I21.3

## 2022-12-02 LAB — CBC WITH DIFFERENTIAL/PLATELET
Abs Immature Granulocytes: 0.03 10*3/uL (ref 0.00–0.07)
Basophils Absolute: 0 10*3/uL (ref 0.0–0.1)
Basophils Relative: 1 %
Eosinophils Absolute: 0.3 10*3/uL (ref 0.0–0.5)
Eosinophils Relative: 4 %
HCT: 39.3 % (ref 36.0–46.0)
Hemoglobin: 13 g/dL (ref 12.0–15.0)
Immature Granulocytes: 0 %
Lymphocytes Relative: 32 %
Lymphs Abs: 2.6 10*3/uL (ref 0.7–4.0)
MCH: 28.5 pg (ref 26.0–34.0)
MCHC: 33.1 g/dL (ref 30.0–36.0)
MCV: 86.2 fL (ref 80.0–100.0)
Monocytes Absolute: 0.7 10*3/uL (ref 0.1–1.0)
Monocytes Relative: 8 %
Neutro Abs: 4.4 10*3/uL (ref 1.7–7.7)
Neutrophils Relative %: 55 %
Platelets: 284 10*3/uL (ref 150–400)
RBC: 4.56 MIL/uL (ref 3.87–5.11)
RDW: 14 % (ref 11.5–15.5)
WBC: 8.1 10*3/uL (ref 4.0–10.5)
nRBC: 0 % (ref 0.0–0.2)

## 2022-12-02 LAB — HEMOGLOBIN A1C
Hgb A1c MFr Bld: 5.5 % (ref 4.8–5.6)
Mean Plasma Glucose: 111.15 mg/dL

## 2022-12-02 LAB — PROTIME-INR
INR: 1 (ref 0.8–1.2)
Prothrombin Time: 13.6 seconds (ref 11.4–15.2)

## 2022-12-02 SURGERY — CORONARY/GRAFT ACUTE MI REVASCULARIZATION
Anesthesia: LOCAL

## 2022-12-02 MED ORDER — LIDOCAINE HCL (PF) 1 % IJ SOLN
INTRAMUSCULAR | Status: AC
  Filled 2022-12-02: qty 30

## 2022-12-02 MED ORDER — SODIUM CHLORIDE 0.9 % IV SOLN: 250.0000 mL | INTRAVENOUS | Status: DC | PRN

## 2022-12-02 MED ORDER — CHLORHEXIDINE GLUCONATE CLOTH 2 % EX PADS
6.0000 | MEDICATED_PAD | Freq: Every day | CUTANEOUS | Status: DC
  Administered 2022-12-03: 6 via TOPICAL

## 2022-12-02 MED ORDER — TICAGRELOR 90 MG PO TABS
ORAL_TABLET | ORAL | Status: AC
  Filled 2022-12-02: qty 2

## 2022-12-02 MED ORDER — SODIUM CHLORIDE 0.9 % WEIGHT BASED INFUSION
1.0000 mL/kg/h | INTRAVENOUS | Status: DC
  Administered 2022-12-03: 1 mL/kg/h via INTRAVENOUS

## 2022-12-02 MED ORDER — SODIUM CHLORIDE 0.9% FLUSH: 3.0000 mL | INTRAVENOUS | Status: DC | PRN

## 2022-12-02 MED ORDER — ACETAMINOPHEN 325 MG PO TABS: 650.0000 mg | ORAL_TABLET | ORAL | Status: DC | PRN

## 2022-12-02 MED ORDER — LABETALOL HCL 5 MG/ML IV SOLN: 10.0000 mg | INTRAVENOUS | Status: AC | PRN

## 2022-12-02 MED ORDER — ROSUVASTATIN CALCIUM 20 MG PO TABS
40.0000 mg | ORAL_TABLET | Freq: Every day | ORAL | Status: DC
  Filled 2022-12-02: qty 2

## 2022-12-02 MED ORDER — ASPIRIN 81 MG PO TBEC: 81.0000 mg | DELAYED_RELEASE_TABLET | Freq: Every day | ORAL | Status: DC

## 2022-12-02 MED ORDER — HEPARIN (PORCINE) IN NACL 1000-0.9 UT/500ML-% IV SOLN
INTRAVENOUS | Status: DC | PRN
  Administered 2022-12-02 (×2): 500 mL

## 2022-12-02 MED ORDER — TICAGRELOR 90 MG PO TABS
90.0000 mg | ORAL_TABLET | Freq: Two times a day (BID) | ORAL | Status: DC
  Administered 2022-12-03: 90 mg via ORAL
  Filled 2022-12-02: qty 1

## 2022-12-02 MED ORDER — SODIUM CHLORIDE 0.9% FLUSH
3.0000 mL | Freq: Two times a day (BID) | INTRAVENOUS | Status: DC
  Administered 2022-12-03: 3 mL via INTRAVENOUS

## 2022-12-02 MED ORDER — HEPARIN SODIUM (PORCINE) 5000 UNIT/ML IJ SOLN
4000.0000 [IU] | Freq: Once | INTRAMUSCULAR | Status: AC
  Administered 2022-12-02: 4000 [IU] via INTRAVENOUS

## 2022-12-02 MED ORDER — ONDANSETRON HCL 4 MG/2ML IJ SOLN: 4.0000 mg | Freq: Four times a day (QID) | INTRAMUSCULAR | Status: DC | PRN

## 2022-12-02 MED ORDER — MIDAZOLAM HCL 2 MG/2ML IJ SOLN
INTRAMUSCULAR | Status: DC | PRN
  Administered 2022-12-02 (×2): 1 mg via INTRAVENOUS

## 2022-12-02 MED ORDER — TICAGRELOR 90 MG PO TABS
ORAL_TABLET | ORAL | Status: DC | PRN
  Administered 2022-12-02: 180 mg via ORAL

## 2022-12-02 MED ORDER — MIDAZOLAM HCL 2 MG/2ML IJ SOLN
INTRAMUSCULAR | Status: AC
  Filled 2022-12-02: qty 2

## 2022-12-02 MED ORDER — VERAPAMIL HCL 2.5 MG/ML IV SOLN
INTRAVENOUS | Status: DC | PRN
  Administered 2022-12-02: 10 mL via INTRA_ARTERIAL

## 2022-12-02 MED ORDER — TRAZODONE HCL 150 MG PO TABS
150.0000 mg | ORAL_TABLET | Freq: Every day | ORAL | Status: DC
  Filled 2022-12-02 (×2): qty 1

## 2022-12-02 MED ORDER — MORPHINE SULFATE (PF) 2 MG/ML IV SOLN: 2.0000 mg | INTRAVENOUS | Status: DC | PRN

## 2022-12-02 MED ORDER — FENTANYL CITRATE (PF) 100 MCG/2ML IJ SOLN
INTRAMUSCULAR | Status: DC | PRN
  Administered 2022-12-02: 25 ug via INTRAVENOUS
  Administered 2022-12-02: 50 ug via INTRAVENOUS

## 2022-12-02 MED ORDER — SODIUM CHLORIDE 0.9 % IV SOLN
INTRAVENOUS | Status: DC | PRN
  Administered 2022-12-02: 250 mL via INTRAVENOUS

## 2022-12-02 MED ORDER — TOPIRAMATE 25 MG PO TABS
50.0000 mg | ORAL_TABLET | Freq: Two times a day (BID) | ORAL | Status: DC
  Administered 2022-12-03 (×2): 50 mg via ORAL
  Filled 2022-12-02 (×3): qty 2

## 2022-12-02 MED ORDER — METOPROLOL SUCCINATE 12.5 MG HALF TABLET
12.5000 mg | ORAL_TABLET | Freq: Every day | ORAL | Status: DC
  Administered 2022-12-03: 12.5 mg via ORAL
  Filled 2022-12-02: qty 1

## 2022-12-02 MED ORDER — FENTANYL CITRATE (PF) 100 MCG/2ML IJ SOLN
INTRAMUSCULAR | Status: AC
  Filled 2022-12-02: qty 2

## 2022-12-02 MED ORDER — HEPARIN SODIUM (PORCINE) 1000 UNIT/ML IJ SOLN
INTRAMUSCULAR | Status: AC
  Filled 2022-12-02: qty 10

## 2022-12-02 MED ORDER — OXYBUTYNIN CHLORIDE 5 MG PO TABS
5.0000 mg | ORAL_TABLET | Freq: Every day | ORAL | Status: DC
  Filled 2022-12-02: qty 1

## 2022-12-02 MED ORDER — LIDOCAINE HCL (PF) 1 % IJ SOLN
INTRAMUSCULAR | Status: DC | PRN
  Administered 2022-12-02: 2 mL

## 2022-12-02 MED ORDER — HEPARIN SODIUM (PORCINE) 1000 UNIT/ML IJ SOLN
INTRAMUSCULAR | Status: DC | PRN
  Administered 2022-12-02: 4000 [IU] via INTRAVENOUS
  Administered 2022-12-02: 3000 [IU] via INTRAVENOUS

## 2022-12-02 MED ORDER — HYDRALAZINE HCL 20 MG/ML IJ SOLN: 10.0000 mg | INTRAMUSCULAR | Status: AC | PRN

## 2022-12-02 MED ORDER — IOHEXOL 350 MG/ML SOLN
INTRAVENOUS | Status: DC | PRN
  Administered 2022-12-02: 100 mL

## 2022-12-02 MED ORDER — LORATADINE 10 MG PO TABS
10.0000 mg | ORAL_TABLET | Freq: Every day | ORAL | Status: DC
  Administered 2022-12-03: 10 mg via ORAL
  Filled 2022-12-02: qty 1

## 2022-12-02 MED ORDER — PANTOPRAZOLE SODIUM 40 MG PO TBEC
40.0000 mg | DELAYED_RELEASE_TABLET | Freq: Every day | ORAL | Status: DC
  Administered 2022-12-03: 40 mg via ORAL
  Filled 2022-12-02: qty 1

## 2022-12-02 MED ORDER — ASPIRIN 81 MG PO CHEW: 81.0000 mg | CHEWABLE_TABLET | Freq: Every day | ORAL | Status: DC

## 2022-12-02 MED ORDER — BACLOFEN 10 MG PO TABS
10.0000 mg | ORAL_TABLET | Freq: Three times a day (TID) | ORAL | Status: DC
  Administered 2022-12-03: 10 mg via ORAL
  Filled 2022-12-02 (×3): qty 1

## 2022-12-02 MED ORDER — ASPIRIN 81 MG PO TBEC
81.0000 mg | DELAYED_RELEASE_TABLET | Freq: Every day | ORAL | Status: DC
  Administered 2022-12-03: 81 mg via ORAL
  Filled 2022-12-02: qty 1

## 2022-12-02 SURGICAL SUPPLY — 20 items
BALLN EMERGE MR 2.0X15 (BALLOONS) ×1
BALLOON EMERGE MR 2.0X15 (BALLOONS) IMPLANT
CATH 5FR JL3.5 JR4 ANG PIG MP (CATHETERS) IMPLANT
CATH LAUNCHER 5F EBU3.0 (CATHETERS) IMPLANT
CATH LAUNCHER 5F EBU3.5 (CATHETERS) IMPLANT
CATHETER LAUNCHER 5F EBU3.0 (CATHETERS) ×1
DEVICE RAD TR BAND REGULAR (VASCULAR PRODUCTS) IMPLANT
GLIDESHEATH SLEND SS 6F .021 (SHEATH) IMPLANT
GUIDEWIRE INQWIRE 1.5J.035X260 (WIRE) IMPLANT
INQWIRE 1.5J .035X260CM (WIRE) ×1
KIT ENCORE 26 ADVANTAGE (KITS) IMPLANT
PACK CARDIAC CATHETERIZATION (CUSTOM PROCEDURE TRAY) ×2 IMPLANT
PROTECTION STATION PRESSURIZED (MISCELLANEOUS) ×1
SET ATX-X65L (MISCELLANEOUS) IMPLANT
STATION PROTECTION PRESSURIZED (MISCELLANEOUS) IMPLANT
STENT SYNERGY XD 2.25X16 (Permanent Stent) IMPLANT
SYNERGY XD 2.25X16 (Permanent Stent) ×1 IMPLANT
TUBING CIL FLEX 10 FLL-RA (TUBING) IMPLANT
WIRE COUGAR XT STRL 190CM (WIRE) IMPLANT
WIRE HI TORQ VERSACORE-J 145CM (WIRE) IMPLANT

## 2022-12-02 NOTE — ED Notes (Signed)
Cardiologist at bedside, instructed to go to cath lab. Transported to cath lab with this RN.

## 2022-12-02 NOTE — ED Triage Notes (Signed)
BIB [facility], code stemi was called on field, per EMS pt c/o of chest pain radiating down the right arm that has been coming and going since 1900 and has been constant starting around 2100. No N/V or dizziness, denies any headache and blurry vision. Pt took 4 81 mg aspirin at home. Pt stated 4/10 pain   En route EMS gave 2 of sublingual nitroglycerin and 6 mg of morphine.

## 2022-12-02 NOTE — ED Provider Notes (Signed)
[place] EMERGENCY DEPARTMENT AT [facility] Provider Note   CSN: [id number] Arrival date & time: 12/02/22  2236     History  Chief Complaint  Patient presents with   Code STEMI    Sarah Vargas is a 62 y.o. female.  HPI Patient came in as a code STEMI.  Chest pain started around 9.  Went chest to the arm and then up to the jaw.  Worsened attempted to do dishes.  Called STEMI by EMS.  Has had nitroglycerin for baby aspirin and 6 morphine.  States pain is feeling much better.  History of hypertension.   Past Medical History:  Diagnosis Date   Back pain    Chest pain    12/2010.   Fibromyalgia    Generalized headaches    GERD (gastroesophageal reflux disease) 12/10/2010   HTN (hypertension) 12/10/2010   Kidney stones    s/p ureteral stents-removed in past.   LBP (low back pain) 12/10/2010   Osteoarthritis    Positive H. pylori test 12/10/2010   Treated 2011.   Tobacco abuse 12/10/2010    Home Medications Prior to Admission medications   Medication Sig Start Date End Date Taking? Authorizing Provider  acyclovir (ZOVIRAX) 400 MG tablet Take 400 mg by mouth as needed.    [provider]  aspirin EC 81 MG tablet Take 81 mg by mouth daily. Swallow whole.    [provider]  baclofen (LIORESAL) 10 MG tablet Take 10 mg by mouth 3 (three) times daily.    [provider]  Calcium Carb-Cholecalciferol (CALCIUM 600 + D PO) Take 1 tablet by mouth in the morning and at bedtime.    [provider]  celecoxib (CELEBREX) 200 MG capsule Take 200 mg by mouth daily.    [provider]  clobetasol cream (TEMOVATE) 0.05 % Apply 1 application topically as needed.    [provider]  DULOXETINE HCL PO Take 90 mg by mouth daily.    [provider]  ibuprofen (ADVIL,MOTRIN) 200 MG tablet Take 200 mg by mouth every 6 (six) hours as needed. For pain     [provider]  loratadine (CLARITIN) 10 MG tablet Take 10 mg  by mouth daily.    [provider]  metFORMIN (GLUCOPHAGE) 500 MG tablet Take 500 mg by mouth daily with breakfast.    [provider]  metoprolol succinate (TOPROL-XL) 25 MG 24 hr tablet Take 25 mg by mouth in the morning and at bedtime.    [provider]  Multiple Vitamin (MULTIVITAMIN) tablet Take 1 tablet by mouth daily.    [provider]  omeprazole (PRILOSEC) 20 MG capsule Take 20 mg by mouth daily.    [provider]  oxybutynin (DITROPAN) 5 MG tablet Take 5 mg by mouth daily.    [provider]  pravastatin (PRAVACHOL) 80 MG tablet Take 80 mg by mouth daily.    [provider]  topiramate (TOPAMAX) 50 MG tablet Take 50 mg by mouth 2 (two) times daily.    [provider]  traZODone (DESYREL) 150 MG tablet Take 150 mg by mouth at bedtime.    [provider]      Allergies    Patient has no known allergies.    Review of Systems   Review of Systems  Physical Exam Updated Vital Signs There were no vitals taken for this visit. Physical Exam Vitals reviewed.  Cardiovascular:     Rate and Rhythm: Regular  rhythm.  Chest:     Chest wall: No tenderness.  Abdominal:     Tenderness: There is no abdominal tenderness.  Musculoskeletal:     Cervical back: Neck supple.     Right lower leg: No edema.     Left lower leg: No edema.  Neurological:     Mental Status: She is alert.     ED Results / Procedures / Treatments   Labs (all labs ordered are listed, but only abnormal results are displayed) Labs Reviewed - No data to display  EKG None  Radiology No results found.  Procedures Procedures    Medications Ordered in ED Medications  heparin injection 4,000 Units (has no administration in time range)    ED Course/ Medical Decision Making/ A&P                                 Medical Decision Making Risk Decision regarding hospitalization.    patient with STEMI.  Pain started at 7.   Improved now.  I reviewed EMS EKG.  Does show ST elevation with reciprocal changes.  Met at bedside by cardiology fellow.  Cath Lab is ready and will be taken emergently up.  Given heparin here        Final Clinical Impression(s) / ED Diagnoses Final diagnoses:  ST elevation myocardial infarction (STEMI), unspecified artery [facility])    Rx / DC Orders ED Discharge Orders     None         Benjiman Core, MD 12/02/22 2244

## 2022-12-02 NOTE — Progress Notes (Signed)
Chaplain responded to Code Stemi to meet patient in ED.  Pt already transported to cath lab.  Pt's nurse reported no family present and no family coming.   Chaplain on standby if needed.  Vernell Morgans Chaplain

## 2022-12-02 NOTE — H&P (Addendum)
Cardiology Admission History and Physical   Patient ID: Sarah Vargas MRN: [id number]; DOB: 16-Jul-1960   Admission date: 12/02/2022  PCP:  Rebecka Apley, NP   [facility] Providers Cardiologist:  None        Chief Complaint:  STEMI  Patient Profile:   Sarah Vargas is a 62 y.o. female with hypertension, hyperlipidemia, tobacco use who is being seen 12/02/2022 for the evaluation of chest pain.  History of Present Illness:   [name] reports she was in her usual state of health until 7 PM tonight when she had sharp, midsternal chest pain with no radiation that began at rest.  The pain then worsened around 9 PM with exertion at which point she called EMS.  She reports 1 other episode of chest pain at a time when she was diagnosed with hypertension.  She reportedly had a heart catheterization that showed no blockages.  No records are available in our system or Care Everywhere.  She has a stress test from 2015 that shows no evidence of ischemia at 77% max predicted heart rate.   Past Medical History:  Diagnosis Date   Back pain    Chest pain    12/2010.   Fibromyalgia    Generalized headaches    GERD (gastroesophageal reflux disease) 12/10/2010   HTN (hypertension) 12/10/2010   Kidney stones    s/p ureteral stents-removed in past.   LBP (low back pain) 12/10/2010   Osteoarthritis    Positive H. pylori test 12/10/2010   Treated 2011.   STEMI (ST elevation myocardial infarction) (HCC) 12/02/2022   Tobacco abuse 12/10/2010    Past Surgical History:  Procedure Laterality Date   SALPINGOOPHORECTOMY     For hx of tubal pregnancy   TUBAL LIGATION     URETERAL STENT PLACEMENT       Medications Prior to Admission: Prior to Admission medications   Medication Sig Start Date End Date Taking? Authorizing Provider  acyclovir (ZOVIRAX) 400 MG tablet Take 400 mg by mouth as needed.    [provider]  aspirin EC 81 MG tablet Take 81 mg by mouth daily.  Swallow whole.    [provider]  baclofen (LIORESAL) 10 MG tablet Take 10 mg by mouth 3 (three) times daily.    [provider]  Calcium Carb-Cholecalciferol (CALCIUM 600 + D PO) Take 1 tablet by mouth in the morning and at bedtime.    [provider]  celecoxib (CELEBREX) 200 MG capsule Take 200 mg by mouth daily.    [provider]  clobetasol cream (TEMOVATE) 0.05 % Apply 1 application topically as needed.    [provider]  DULOXETINE HCL PO Take 90 mg by mouth daily.    [provider]  ibuprofen (ADVIL,MOTRIN) 200 MG tablet Take 200 mg by mouth every 6 (six) hours as needed. For pain     [provider]  loratadine (CLARITIN) 10 MG tablet Take 10 mg by mouth daily.    [provider]  metFORMIN (GLUCOPHAGE) 500 MG tablet Take 500 mg by mouth daily with breakfast.    [provider]  metoprolol succinate (TOPROL-XL) 25 MG 24 hr tablet Take 25 mg by mouth in the morning and at bedtime.    [provider]  Multiple Vitamin (MULTIVITAMIN) tablet Take 1 tablet by mouth daily.    [provider]  omeprazole (PRILOSEC) 20 MG capsule Take 20 mg by mouth daily.    [provider]  oxybutynin (DITROPAN) 5 MG tablet Take 5 mg by mouth daily.    [provider]  pravastatin (PRAVACHOL) 80 MG tablet Take 80 mg by mouth daily.    [provider]  topiramate (TOPAMAX) 50 MG tablet Take 50 mg by mouth 2 (two) times daily.    [provider]  traZODone (DESYREL) 150 MG tablet Take 150 mg by mouth at bedtime.    [provider]     Allergies:   No Known Allergies  Social History:   Social History   Socioeconomic History   Marital status: Single    Spouse name: Not on file   Number of children: Not on file   Years of education: Not on file   Highest education level: Not on file  Occupational History    Comment: Unemployed  Tobacco Use   Smoking status:  Every Day    Current packs/day: 0.50    Average packs/day: 0.5 packs/day for 30.0 years (15.0 ttl pk-yrs)    Types: Cigarettes   Smokeless tobacco: Never  Substance and Sexual Activity   Alcohol use: Yes    Comment: occas   Drug use: No   Sexual activity: Not on file  Other Topics Concern   Not on file  Social History Narrative   Lives in [place]   Unemployed   Divorced   Social Determinants of Health   Financial Resource Strain: Medium Risk (07/02/2022)   Received from [facility]   Overall Financial Resource Strain (CARDIA)    Difficulty of Paying Living Expenses: Somewhat hard  Food Insecurity: No Food Insecurity (08/25/2022)   Received from [facility]   Hunger Vital Sign    Worried About Running Out of Food in the Last Year: Never true    Ran Out of Food in the Last Year: Never true  Transportation Needs: No Transportation Needs (08/25/2022)   Received from [facility] - Transportation    Lack of Transportation (Medical): No    Lack of Transportation (Non-Medical): No  Recent Concern: Transportation Needs - Unmet Transportation Needs (07/02/2022)   Received from [facility] - Transportation    Lack of Transportation (Medical): No    Lack of Transportation (Non-Medical): Yes  Physical Activity: Unknown (07/02/2022)   Received from [facility]   Exercise Vital Sign    Days of Exercise per Week: Patient declined    Minutes of Exercise per Session: Not on file  Stress: Stress Concern Present (07/02/2022)   Received from [facility] - Occupational Stress Questionnaire    Feeling of Stress : To some extent  Social Connections: Socially Integrated (07/02/2022)   Received from [facility]   Social Network    How would you rate your social network (family, work, friends)?: Good participation with social networks  Intimate Partner Violence: Not At Risk (07/02/2022)   Received from [facility]    HITS    Over the last 12 months how often did your partner physically hurt you?: 1    Over the last 12 months how often did your partner insult you or talk down to you?: 1    Over the last 12 months how often did your partner threaten you with physical harm?: 1    Over the last 12 months how often did your partner scream or curse at you?: 1    Family History: The patient's family history includes Heart attack in her  father and mother; Hypertension in her father and mother.    ROS:  Please see the history of present illness.  All other ROS reviewed and negative.     Physical Exam/Data:  There were no vitals filed for this visit. No intake or output data in the 24 hours ending 12/02/22 2302    11/23/2019    8:24 AM 12/10/2010    1:09 PM  Last 3 Weights  Weight (lbs) 135 lb 152 lb  Weight (kg) 61.236 kg 68.947 kg     There is no height or weight on file to calculate BMI.  General:  Well nourished, well developed, in no acute distress HEENT: normal Neck: no JVD Vascular: No carotid bruits; Distal pulses 2+ bilaterally   Cardiac:  normal S1, S2; RRR; no murmur  Lungs:  clear to auscultation bilaterally, no wheezing, rhonchi or rales  Abd: soft, nontender, no hepatomegaly  Ext: no edema Musculoskeletal:  No deformities, BUE and BLE strength normal and equal Skin: warm and dry  Neuro:  CNs 2-12 intact, no focal abnormalities noted Psych:  Normal affect    EKG:  The ECG that was done and was personally reviewed and demonstrates ST elevations in lead I and aVL with reciprocal change in inferior leads  Relevant CV Studies: none  Laboratory Data:  High Sensitivity Troponin:  No results for input(s): "TROPONINIHS" in the last 720 hours.    ChemistryNo results for input(s): "NA", "K", "CL", "CO2", "GLUCOSE", "BUN", "CREATININE", "CALCIUM", "MG", "GFRNONAA", "GFRAA", "ANIONGAP" in the last 168 hours.  No results for input(s): "PROT", "ALBUMIN", "AST", "ALT", "ALKPHOS", "BILITOT" in  the last 168 hours. Lipids No results for input(s): "CHOL", "TRIG", "HDL", "LABVLDL", "LDLCALC", "CHOLHDL" in the last 168 hours. HematologyNo results for input(s): "WBC", "RBC", "HGB", "HCT", "MCV", "MCH", "MCHC", "RDW", "PLT" in the last 168 hours. Thyroid No results for input(s): "TSH", "FREET4" in the last 168 hours. BNPNo results for input(s): "BNP", "PROBNP" in the last 168 hours.  DDimer No results for input(s): "DDIMER" in the last 168 hours.   Radiology/Studies:  No results found.   Assessment and Plan:   STEMI   - Echo - Lipid panel - LpA - A1c - ASA/Ticag daily - rosuvastatin 40 mg daily - Metoprolol Hypertension - monitor BP post-cath HLD Tobacco use disorder - needs counseling   Risk Assessment/Risk Scores:    TIMI Risk Score for ST  Elevation MI:   The patient's TIMI risk score is 2, which indicates a 2.2% risk of all cause mortality at 30 days.    Code Status: Full Code  Severity of Illness: The appropriate patient status for this patient is INPATIENT. Inpatient status is judged to be reasonable and necessary in order to provide the required intensity of service to ensure the patient's safety. The patient's presenting symptoms, physical exam findings, and initial radiographic and laboratory data in the context of their chronic comorbidities is felt to place them at high risk for further clinical deterioration. Furthermore, it is not anticipated that the patient will be medically stable for discharge from the hospital within 2 midnights of admission.   * I certify that at the point of admission it is my clinical judgment that the patient will require inpatient hospital care spanning beyond 2 midnights from the point of admission due to high intensity of service, high risk for further deterioration and high frequency of surveillance required.*   For questions or updates, please contact [facility] Please consult [email or web address] for contact info under  Signed, Roderic Palau, MD  12/02/2022 11:02 PM

## 2022-12-03 ENCOUNTER — Other Ambulatory Visit (HOSPITAL_COMMUNITY): Payer: Self-pay

## 2022-12-03 ENCOUNTER — Other Ambulatory Visit: Payer: Self-pay

## 2022-12-03 ENCOUNTER — Encounter (HOSPITAL_COMMUNITY): Payer: Self-pay | Admitting: Cardiovascular Disease

## 2022-12-03 ENCOUNTER — Inpatient Hospital Stay (HOSPITAL_COMMUNITY): Payer: Medicaid Other

## 2022-12-03 ENCOUNTER — Telehealth: Payer: Self-pay | Admitting: Cardiology

## 2022-12-03 DIAGNOSIS — I213 ST elevation (STEMI) myocardial infarction of unspecified site: Secondary | ICD-10-CM

## 2022-12-03 DIAGNOSIS — R079 Chest pain, unspecified: Secondary | ICD-10-CM

## 2022-12-03 DIAGNOSIS — I2109 ST elevation (STEMI) myocardial infarction involving other coronary artery of anterior wall: Secondary | ICD-10-CM

## 2022-12-03 LAB — BASIC METABOLIC PANEL
Anion gap: 10 (ref 5–15)
BUN: 33 mg/dL — ABNORMAL HIGH (ref 8–23)
CO2: 19 mmol/L — ABNORMAL LOW (ref 22–32)
Calcium: 8.4 mg/dL — ABNORMAL LOW (ref 8.9–10.3)
Chloride: 107 mmol/L (ref 98–111)
Creatinine, Ser: 1.07 mg/dL — ABNORMAL HIGH (ref 0.44–1.00)
GFR, Estimated: 59 mL/min — ABNORMAL LOW (ref >60)
Glucose, Bld: 124 mg/dL — ABNORMAL HIGH (ref 70–99)
Potassium: 4.6 mmol/L (ref 3.5–5.1)
Sodium: 136 mmol/L (ref 135–145)

## 2022-12-03 LAB — HIV ANTIBODY (ROUTINE TESTING W REFLEX): HIV Screen 4th Generation wRfx: NONREACTIVE

## 2022-12-03 LAB — POCT I-STAT, CHEM 8
BUN: 31 mg/dL — ABNORMAL HIGH (ref 8–23)
Calcium, Ion: 1.24 mmol/L (ref 1.15–1.40)
Chloride: 108 mmol/L (ref 98–111)
Creatinine, Ser: 1.1 mg/dL — ABNORMAL HIGH (ref 0.44–1.00)
Glucose, Bld: 132 mg/dL — ABNORMAL HIGH (ref 70–99)
HCT: 38 % (ref 36.0–46.0)
Hemoglobin: 12.9 g/dL (ref 12.0–15.0)
Potassium: 4.3 mmol/L (ref 3.5–5.1)
Sodium: 140 mmol/L (ref 135–145)
TCO2: 20 mmol/L — ABNORMAL LOW (ref 22–32)

## 2022-12-03 LAB — COMPREHENSIVE METABOLIC PANEL
ALT: 22 U/L (ref 0–44)
AST: 25 U/L (ref 15–41)
Albumin: 3.3 g/dL — ABNORMAL LOW (ref 3.5–5.0)
Alkaline Phosphatase: 116 U/L (ref 38–126)
Anion gap: 7 (ref 5–15)
BUN: 33 mg/dL — ABNORMAL HIGH (ref 8–23)
CO2: 20 mmol/L — ABNORMAL LOW (ref 22–32)
Calcium: 8.1 mg/dL — ABNORMAL LOW (ref 8.9–10.3)
Chloride: 109 mmol/L (ref 98–111)
Creatinine, Ser: 1.06 mg/dL — ABNORMAL HIGH (ref 0.44–1.00)
GFR, Estimated: 60 mL/min — ABNORMAL LOW (ref >60)
Glucose, Bld: 131 mg/dL — ABNORMAL HIGH (ref 70–99)
Potassium: 4.2 mmol/L (ref 3.5–5.1)
Sodium: 136 mmol/L (ref 135–145)
Total Bilirubin: 0.4 mg/dL (ref 0.3–1.2)
Total Protein: 5.8 g/dL — ABNORMAL LOW (ref 6.5–8.1)

## 2022-12-03 LAB — TROPONIN I (HIGH SENSITIVITY)
Troponin I (High Sensitivity): 282 ng/L (ref <18)
Troponin I (High Sensitivity): 882 ng/L (ref <18)

## 2022-12-03 LAB — CBC
HCT: 40 % (ref 36.0–46.0)
Hemoglobin: 12.8 g/dL (ref 12.0–15.0)
MCH: 28.2 pg (ref 26.0–34.0)
MCHC: 32 g/dL (ref 30.0–36.0)
MCV: 88.1 fL (ref 80.0–100.0)
Platelets: 275 10*3/uL (ref 150–400)
RBC: 4.54 MIL/uL (ref 3.87–5.11)
RDW: 14.1 % (ref 11.5–15.5)
WBC: 8.9 10*3/uL (ref 4.0–10.5)
nRBC: 0 % (ref 0.0–0.2)

## 2022-12-03 LAB — ECHOCARDIOGRAM COMPLETE
Area-P 1/2: 3.53 cm2
MV M vel: 4.37 m/s
MV Peak grad: 76.2 mmHg
S' Lateral: 2.7 cm
Weight: 2190.49 oz

## 2022-12-03 LAB — POCT ACTIVATED CLOTTING TIME
Activated Clotting Time: 250 seconds
Activated Clotting Time: 275 seconds

## 2022-12-03 LAB — LIPID PANEL
Cholesterol: 154 mg/dL (ref 0–200)
HDL: 54 mg/dL (ref >40)
LDL Cholesterol: 80 mg/dL (ref 0–99)
Total CHOL/HDL Ratio: 2.9 RATIO
Triglycerides: 101 mg/dL (ref <150)
VLDL: 20 mg/dL (ref 0–40)

## 2022-12-03 LAB — CG4 I-STAT (LACTIC ACID): Lactic Acid, Venous: 0.6 mmol/L (ref 0.5–1.9)

## 2022-12-03 LAB — MRSA NEXT GEN BY PCR, NASAL: MRSA by PCR Next Gen: NOT DETECTED

## 2022-12-03 LAB — APTT: aPTT: 179 seconds (ref 24–36)

## 2022-12-03 MED ORDER — PANTOPRAZOLE SODIUM 40 MG PO TBEC
40.0000 mg | DELAYED_RELEASE_TABLET | Freq: Every day | ORAL | 3 refills | Status: DC
  Filled 2022-12-03: qty 30, 30d supply, fill #0

## 2022-12-03 MED ORDER — METOPROLOL SUCCINATE ER 25 MG PO TB24
12.5000 mg | ORAL_TABLET | Freq: Every day | ORAL | 0 refills | Status: DC
  Filled 2022-12-03: qty 15, 30d supply, fill #0

## 2022-12-03 MED ORDER — PNEUMOCOCCAL 20-VAL CONJ VACC 0.5 ML IM SUSY: 0.5000 mL | PREFILLED_SYRINGE | INTRAMUSCULAR | Status: DC

## 2022-12-03 MED ORDER — ASPIRIN 81 MG PO TBEC
81.0000 mg | DELAYED_RELEASE_TABLET | Freq: Every day | ORAL | 12 refills | Status: AC
  Filled 2022-12-03: qty 120, 120d supply, fill #0

## 2022-12-03 MED ORDER — SODIUM CHLORIDE 0.9 % WEIGHT BASED INFUSION
1.0000 mL/kg/h | INTRAVENOUS | Status: AC
  Administered 2022-12-03: 1 mL/kg/h via INTRAVENOUS

## 2022-12-03 MED ORDER — METOPROLOL SUCCINATE ER 25 MG PO TB24
12.5000 mg | ORAL_TABLET | Freq: Every day | ORAL | 0 refills | Status: DC
  Filled 2022-12-03: qty 45, 90d supply, fill #0

## 2022-12-03 MED ORDER — ROSUVASTATIN CALCIUM 40 MG PO TABS
40.0000 mg | ORAL_TABLET | Freq: Every day | ORAL | 1 refills | Status: DC
  Filled 2022-12-03: qty 90, 90d supply, fill #0

## 2022-12-03 MED ORDER — INFLUENZA VIRUS VACC SPLIT PF (FLUZONE) 0.5 ML IM SUSY: 0.5000 mL | PREFILLED_SYRINGE | INTRAMUSCULAR | Status: DC

## 2022-12-03 MED ORDER — DULOXETINE HCL 60 MG PO CPEP
90.0000 mg | ORAL_CAPSULE | Freq: Every day | ORAL | Status: DC
  Administered 2022-12-03: 90 mg via ORAL
  Filled 2022-12-03: qty 1

## 2022-12-03 MED ORDER — PANTOPRAZOLE SODIUM 40 MG PO TBEC
40.0000 mg | DELAYED_RELEASE_TABLET | Freq: Every day | ORAL | 1 refills | Status: DC
  Filled 2022-12-03: qty 90, 90d supply, fill #0

## 2022-12-03 MED ORDER — NITROGLYCERIN 0.4 MG SL SUBL
0.4000 mg | SUBLINGUAL_TABLET | SUBLINGUAL | 3 refills | Status: DC | PRN
  Filled 2022-12-03: qty 25, 8d supply, fill #0

## 2022-12-03 MED ORDER — ORAL CARE MOUTH RINSE: 15.0000 mL | OROMUCOSAL | Status: DC | PRN

## 2022-12-03 MED ORDER — ROSUVASTATIN CALCIUM 40 MG PO TABS
40.0000 mg | ORAL_TABLET | Freq: Every day | ORAL | 3 refills | Status: DC
  Filled 2022-12-03: qty 30, 30d supply, fill #0

## 2022-12-03 MED ORDER — TICAGRELOR 90 MG PO TABS
90.0000 mg | ORAL_TABLET | Freq: Two times a day (BID) | ORAL | 3 refills | Status: DC
  Filled 2022-12-03: qty 180, 90d supply, fill #0

## 2022-12-03 NOTE — Telephone Encounter (Signed)
Patient currently admitted.  Will need call back once discharged

## 2022-12-03 NOTE — Progress Notes (Signed)
Discharge Summary    Patient ID: Sarah Vargas MRN: [id number]; DOB: 08/31/1960  Admit date: 12/02/2022 Discharge date: 12/03/2022  PCP:  Rebecka Apley, NP   [facility] Providers Cardiologist:  None        Discharge Diagnoses    Principal Problem:   STEMI (ST elevation myocardial infarction) [facility])    Diagnostic Studies/Procedures    12/02/22 LHC  1.  Acute lateral wall MI secondary to total occlusion of first diagonal branch, treated with primary PCI using a 2.25 x 16 mm Synergy DES, reducing 100% stenosis to 0%.  TIMI 0 flow at baseline improved to TIMI-3 post PCI.  There is mild to moderate nonobstructive plaquing proximal to the stented segment of the diagonal.   2.  Nonobstructive mid LAD stenosis estimated at 40 to 50% 3.  Patent left main, left circumflex, and RCA with mild irregularities 4.  Mild hypokinesis of the anterolateral wall with preserved overall LVEF estimated at 55 to 65%   Recommend: CV-ICU care, post MI medical therapy, DAPT with aspirin and ticagrelor x 12 months without interruption, patient may be a candidate for early post MI discharge pending her clinical course.   Diagnostic Dominance: Right  Intervention    12/03/22 TTE  IMPRESSIONS     1. Left ventricular ejection fraction, by estimation, is 55 to 60%. The  left ventricle has normal function. The left ventricle has no regional  wall motion abnormalities. Left ventricular diastolic parameters were  normal.   2. Right ventricular systolic function is normal. The right ventricular  size is normal. There is normal pulmonary artery systolic pressure. The  estimated right ventricular systolic pressure is 29.7 mmHg.   3. The mitral valve is normal in structure. Trivial mitral valve  regurgitation. No evidence of mitral stenosis.   4. The aortic valve is normal in structure. Aortic valve regurgitation is  not visualized. No aortic stenosis is present.   5. The inferior  vena cava is normal in size with <50% respiratory  variability, suggesting right atrial pressure of 8 mmHg.   FINDINGS   Left Ventricle: Left ventricular ejection fraction, by estimation, is 55  to 60%. The left ventricle has normal function. The left ventricle has no  regional wall motion abnormalities. The left ventricular internal cavity  size was normal in size. There is   no left ventricular hypertrophy. Left ventricular diastolic parameters  were normal. Normal left ventricular filling pressure.  _____________   History of Present Illness     Sarah Vargas is a 62 y.o. female with hypertension, hyperlipidemia, tobacco use who was seen 12/02/2022 for the evaluation of chest pain.   [name] reported she was in her usual state of health until 7 PM tonight when she had sharp, midsternal chest pain with no radiation that began at rest.  The pain then worsened around 9 PM with exertion at which point she called EMS.  She reported 1 other episode of chest pain at a time when she was diagnosed with hypertension.  She reportedly had a heart catheterization in the past that showed no blockages.  No records are available in our system or Care Everywhere.  She has a stress test from 2015 that shows no evidence of ischemia at 77% max predicted heart rate.   Hospital Course     Consultants: N/A   Anterolateral STEMI  Patient presented to [facility] ED as a code STEMI. She was taken emergently to the cath lab and found  Height:       65.0 in Accession #:    [id number]      Weight:       136.9 lb Date of Birth:  Apr 21, 1960       BSA:          1.684 m Patient Age:    62 years        BP:           93/62 mmHg Patient Gender: F               HR:           61 bpm. Exam Location:  Inpatient Procedure: 2D Echo, Cardiac Doppler and Color Doppler Indications:    Myocardial Infact I21,9  History:        Patient has no prior history of Echocardiogram examinations.                 STEMI; Risk Factors:Hypertension, Dyslipidemia and Current                 Smoker.  Sonographer:    Aron Baba Referring Phys: [phone] MICHAEL COOPER  Sonographer Comments: Suboptimal parasternal window. IMPRESSIONS  1. Left ventricular ejection fraction, by estimation, is 55 to 60%. The left ventricle has normal function. The left ventricle has no regional wall motion abnormalities. Left ventricular diastolic parameters were normal.  2. Right ventricular systolic function is normal. The right ventricular size is normal. There is normal pulmonary artery systolic pressure. The estimated right ventricular systolic pressure is 29.7 mmHg.  3. The mitral valve is normal in structure. Trivial mitral valve regurgitation. No evidence of mitral stenosis.  4. The  aortic valve is normal in structure. Aortic valve regurgitation is not visualized. No aortic stenosis is present.  5. The inferior vena cava is normal in size with <50% respiratory variability, suggesting right atrial pressure of 8 mmHg. FINDINGS  Left Ventricle: Left ventricular ejection fraction, by estimation, is 55 to 60%. The left ventricle has normal function. The left ventricle has no regional wall motion abnormalities. The left ventricular internal cavity size was normal in size. There is  no left ventricular hypertrophy. Left ventricular diastolic parameters were normal. Normal left ventricular filling pressure. Right Ventricle: The right ventricular size is normal. No increase in right ventricular wall thickness. Right ventricular systolic function is normal. There is normal pulmonary artery systolic pressure. The tricuspid regurgitant velocity is 2.33 m/s, and  with an assumed right atrial pressure of 8 mmHg, the estimated right ventricular systolic pressure is 29.7 mmHg. Left Atrium: Left atrial size was normal in size. Right Atrium: Right atrial size was normal in size. Pericardium: There is no evidence of pericardial effusion. Mitral Valve: The mitral valve is normal in structure. Trivial mitral valve regurgitation. No evidence of mitral valve stenosis. Tricuspid Valve: The tricuspid valve is normal in structure. Tricuspid valve regurgitation is trivial. No evidence of tricuspid stenosis. Aortic Valve: The aortic valve is normal in structure. Aortic valve regurgitation is not visualized. No aortic stenosis is present. Pulmonic Valve: The pulmonic valve was normal in structure. Pulmonic valve regurgitation is not visualized. No evidence of pulmonic stenosis. Aorta: The aortic root is normal in size and structure. Venous: The inferior vena cava is normal in size with less than 50% respiratory variability, suggesting right atrial pressure of 8 mmHg. IAS/Shunts: No atrial level shunt detected by color flow  Doppler.  LEFT VENTRICLE PLAX 2D LVIDd:         3.80  Height:       65.0 in Accession #:    [id number]      Weight:       136.9 lb Date of Birth:  Apr 21, 1960       BSA:          1.684 m Patient Age:    62 years        BP:           93/62 mmHg Patient Gender: F               HR:           61 bpm. Exam Location:  Inpatient Procedure: 2D Echo, Cardiac Doppler and Color Doppler Indications:    Myocardial Infact I21,9  History:        Patient has no prior history of Echocardiogram examinations.                 STEMI; Risk Factors:Hypertension, Dyslipidemia and Current                 Smoker.  Sonographer:    Aron Baba Referring Phys: [phone] MICHAEL COOPER  Sonographer Comments: Suboptimal parasternal window. IMPRESSIONS  1. Left ventricular ejection fraction, by estimation, is 55 to 60%. The left ventricle has normal function. The left ventricle has no regional wall motion abnormalities. Left ventricular diastolic parameters were normal.  2. Right ventricular systolic function is normal. The right ventricular size is normal. There is normal pulmonary artery systolic pressure. The estimated right ventricular systolic pressure is 29.7 mmHg.  3. The mitral valve is normal in structure. Trivial mitral valve regurgitation. No evidence of mitral stenosis.  4. The  aortic valve is normal in structure. Aortic valve regurgitation is not visualized. No aortic stenosis is present.  5. The inferior vena cava is normal in size with <50% respiratory variability, suggesting right atrial pressure of 8 mmHg. FINDINGS  Left Ventricle: Left ventricular ejection fraction, by estimation, is 55 to 60%. The left ventricle has normal function. The left ventricle has no regional wall motion abnormalities. The left ventricular internal cavity size was normal in size. There is  no left ventricular hypertrophy. Left ventricular diastolic parameters were normal. Normal left ventricular filling pressure. Right Ventricle: The right ventricular size is normal. No increase in right ventricular wall thickness. Right ventricular systolic function is normal. There is normal pulmonary artery systolic pressure. The tricuspid regurgitant velocity is 2.33 m/s, and  with an assumed right atrial pressure of 8 mmHg, the estimated right ventricular systolic pressure is 29.7 mmHg. Left Atrium: Left atrial size was normal in size. Right Atrium: Right atrial size was normal in size. Pericardium: There is no evidence of pericardial effusion. Mitral Valve: The mitral valve is normal in structure. Trivial mitral valve regurgitation. No evidence of mitral valve stenosis. Tricuspid Valve: The tricuspid valve is normal in structure. Tricuspid valve regurgitation is trivial. No evidence of tricuspid stenosis. Aortic Valve: The aortic valve is normal in structure. Aortic valve regurgitation is not visualized. No aortic stenosis is present. Pulmonic Valve: The pulmonic valve was normal in structure. Pulmonic valve regurgitation is not visualized. No evidence of pulmonic stenosis. Aorta: The aortic root is normal in size and structure. Venous: The inferior vena cava is normal in size with less than 50% respiratory variability, suggesting right atrial pressure of 8 mmHg. IAS/Shunts: No atrial level shunt detected by color flow  Doppler.  LEFT VENTRICLE PLAX 2D LVIDd:         3.80  Height:       65.0 in Accession #:    [id number]      Weight:       136.9 lb Date of Birth:  Apr 21, 1960       BSA:          1.684 m Patient Age:    62 years        BP:           93/62 mmHg Patient Gender: F               HR:           61 bpm. Exam Location:  Inpatient Procedure: 2D Echo, Cardiac Doppler and Color Doppler Indications:    Myocardial Infact I21,9  History:        Patient has no prior history of Echocardiogram examinations.                 STEMI; Risk Factors:Hypertension, Dyslipidemia and Current                 Smoker.  Sonographer:    Aron Baba Referring Phys: [phone] MICHAEL COOPER  Sonographer Comments: Suboptimal parasternal window. IMPRESSIONS  1. Left ventricular ejection fraction, by estimation, is 55 to 60%. The left ventricle has normal function. The left ventricle has no regional wall motion abnormalities. Left ventricular diastolic parameters were normal.  2. Right ventricular systolic function is normal. The right ventricular size is normal. There is normal pulmonary artery systolic pressure. The estimated right ventricular systolic pressure is 29.7 mmHg.  3. The mitral valve is normal in structure. Trivial mitral valve regurgitation. No evidence of mitral stenosis.  4. The  aortic valve is normal in structure. Aortic valve regurgitation is not visualized. No aortic stenosis is present.  5. The inferior vena cava is normal in size with <50% respiratory variability, suggesting right atrial pressure of 8 mmHg. FINDINGS  Left Ventricle: Left ventricular ejection fraction, by estimation, is 55 to 60%. The left ventricle has normal function. The left ventricle has no regional wall motion abnormalities. The left ventricular internal cavity size was normal in size. There is  no left ventricular hypertrophy. Left ventricular diastolic parameters were normal. Normal left ventricular filling pressure. Right Ventricle: The right ventricular size is normal. No increase in right ventricular wall thickness. Right ventricular systolic function is normal. There is normal pulmonary artery systolic pressure. The tricuspid regurgitant velocity is 2.33 m/s, and  with an assumed right atrial pressure of 8 mmHg, the estimated right ventricular systolic pressure is 29.7 mmHg. Left Atrium: Left atrial size was normal in size. Right Atrium: Right atrial size was normal in size. Pericardium: There is no evidence of pericardial effusion. Mitral Valve: The mitral valve is normal in structure. Trivial mitral valve regurgitation. No evidence of mitral valve stenosis. Tricuspid Valve: The tricuspid valve is normal in structure. Tricuspid valve regurgitation is trivial. No evidence of tricuspid stenosis. Aortic Valve: The aortic valve is normal in structure. Aortic valve regurgitation is not visualized. No aortic stenosis is present. Pulmonic Valve: The pulmonic valve was normal in structure. Pulmonic valve regurgitation is not visualized. No evidence of pulmonic stenosis. Aorta: The aortic root is normal in size and structure. Venous: The inferior vena cava is normal in size with less than 50% respiratory variability, suggesting right atrial pressure of 8 mmHg. IAS/Shunts: No atrial level shunt detected by color flow  Doppler.  LEFT VENTRICLE PLAX 2D LVIDd:         3.80  Discharge Summary    Patient ID: Sarah Vargas MRN: [id number]; DOB: 08/31/1960  Admit date: 12/02/2022 Discharge date: 12/03/2022  PCP:  Rebecka Apley, NP   [facility] Providers Cardiologist:  None        Discharge Diagnoses    Principal Problem:   STEMI (ST elevation myocardial infarction) [facility])    Diagnostic Studies/Procedures    12/02/22 LHC  1.  Acute lateral wall MI secondary to total occlusion of first diagonal branch, treated with primary PCI using a 2.25 x 16 mm Synergy DES, reducing 100% stenosis to 0%.  TIMI 0 flow at baseline improved to TIMI-3 post PCI.  There is mild to moderate nonobstructive plaquing proximal to the stented segment of the diagonal.   2.  Nonobstructive mid LAD stenosis estimated at 40 to 50% 3.  Patent left main, left circumflex, and RCA with mild irregularities 4.  Mild hypokinesis of the anterolateral wall with preserved overall LVEF estimated at 55 to 65%   Recommend: CV-ICU care, post MI medical therapy, DAPT with aspirin and ticagrelor x 12 months without interruption, patient may be a candidate for early post MI discharge pending her clinical course.   Diagnostic Dominance: Right  Intervention    12/03/22 TTE  IMPRESSIONS     1. Left ventricular ejection fraction, by estimation, is 55 to 60%. The  left ventricle has normal function. The left ventricle has no regional  wall motion abnormalities. Left ventricular diastolic parameters were  normal.   2. Right ventricular systolic function is normal. The right ventricular  size is normal. There is normal pulmonary artery systolic pressure. The  estimated right ventricular systolic pressure is 29.7 mmHg.   3. The mitral valve is normal in structure. Trivial mitral valve  regurgitation. No evidence of mitral stenosis.   4. The aortic valve is normal in structure. Aortic valve regurgitation is  not visualized. No aortic stenosis is present.   5. The inferior  vena cava is normal in size with <50% respiratory  variability, suggesting right atrial pressure of 8 mmHg.   FINDINGS   Left Ventricle: Left ventricular ejection fraction, by estimation, is 55  to 60%. The left ventricle has normal function. The left ventricle has no  regional wall motion abnormalities. The left ventricular internal cavity  size was normal in size. There is   no left ventricular hypertrophy. Left ventricular diastolic parameters  were normal. Normal left ventricular filling pressure.  _____________   History of Present Illness     Sarah Vargas is a 62 y.o. female with hypertension, hyperlipidemia, tobacco use who was seen 12/02/2022 for the evaluation of chest pain.   [name] reported she was in her usual state of health until 7 PM tonight when she had sharp, midsternal chest pain with no radiation that began at rest.  The pain then worsened around 9 PM with exertion at which point she called EMS.  She reported 1 other episode of chest pain at a time when she was diagnosed with hypertension.  She reportedly had a heart catheterization in the past that showed no blockages.  No records are available in our system or Care Everywhere.  She has a stress test from 2015 that shows no evidence of ischemia at 77% max predicted heart rate.   Hospital Course     Consultants: N/A   Anterolateral STEMI  Patient presented to [facility] ED as a code STEMI. She was taken emergently to the cath lab and found

## 2022-12-03 NOTE — Progress Notes (Signed)
   Patient Name: Sarah Vargas Date of Encounter: 12/03/2022 [facility] Cardiologist: None   Interval Summary  .    Feeling much better.  No angina since the percutaneous revascularization procedure.  Pressure is relatively low, but she is asymptomatic.  No problems at the radial access site.  Vital Signs .    Vitals:   12/03/22 0530 12/03/22 0545 12/03/22 0600 12/03/22 0800  BP: (!) 84/56 (!) 82/62 (!) 81/59   Pulse: (!) 50 (!) 54 64   Resp: 12 12 (!) 21   Temp:    97.8 F (36.6 C)  TempSrc:    Oral  SpO2: 100% 100% 97%   Weight:   62.1 kg     Intake/Output Summary (Last 24 hours) at 12/03/2022 0914 Last data filed at 12/03/2022 0345 Gross per 24 hour  Intake 1053.58 ml  Output 500 ml  Net 553.58 ml      12/03/2022    6:00 AM 11/23/2019    8:24 AM 12/10/2010    1:09 PM  Last 3 Weights  Weight (lbs) 136 lb 14.5 oz 135 lb 152 lb  Weight (kg) 62.1 kg 61.236 kg 68.947 kg      Telemetry/ECG    NSR - Personally Reviewed  Physical Exam .   GEN: No acute distress.   Neck: No JVD Cardiac: RRR, no murmurs, rubs, or gallops.  Respiratory: Clear to auscultation bilaterally. GI: Soft, nontender, non-distended  MS: No edema  Assessment & Plan .     Small anterolateral STEMI due to diagonal branch occlusion, no arrhythmia, preserved EF, asymptomatic at this time.   BP is currently little low but she is asymptomatic.  Prior to admission she was tolerating metoprolol succinate 25 mg once daily.  Ready for DC later today if does well walking in the unit.  Reviewed the importance of mandatory dual antiplatelet therapy for minimum of 6 months, preferably 12 months, aspirin lifelong.  Advised immediate smoking cessation.  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  On the current treatment with pravastatin her LDL is not bad at 81, she was more intensive lipid-lowering.  Switched to rosuvastatin 40 mg daily.  Reviewed healthy diet, regular  exercise, cardiac rehab recommended (she is disabled due to low back problems but can walk, lives in [place] and does not have a car).  Will have to see if there is a solution for transportation to either [facility] or [facility] rehab.    For questions or updates, please contact [facility] Please consult [email or web address] for contact info under        Signed, Thurmon Fair, MD

## 2022-12-03 NOTE — Plan of Care (Signed)
  Problem: Education: Goal: Understanding of cardiac disease, CV risk reduction, and recovery process will improve Outcome: Progressing   Problem: Cardiac: Goal: Ability to achieve and maintain adequate cardiovascular perfusion will improve Outcome: Progressing   Problem: Health Behavior/Discharge Planning: Goal: Ability to manage health-related needs will improve Outcome: Progressing   Problem: Clinical Measurements: Goal: Ability to maintain clinical measurements within normal limits will improve Outcome: Progressing   Problem: Clinical Measurements: Goal: Will remain free from infection Outcome: Progressing   Problem: Elimination: Goal: Will not experience complications related to urinary retention Outcome: Progressing   Problem: Pain Managment: Goal: General experience of comfort will improve Outcome: Progressing

## 2022-12-03 NOTE — Progress Notes (Signed)
CSW received consult for patient. CSW offered patient transportation resources. Patient accepted. All questions answered. No further questions reported at this time.

## 2022-12-03 NOTE — Telephone Encounter (Signed)
Transition of Care Follow-up Phone Call Request    Patient Name: Sarah Vargas Date of Birth: 11-26-1960 Date of Encounter: 12/03/2022  Primary Care Provider:  Rebecka Apley, NP Primary Cardiologist:  None  Sarah Vargas has been scheduled for a transition of care follow up appointment with a [facility] provider:  Edd Fabian on 12/09/22 @ 10:05am.   Please reach out to Sarah Vargas within 48 hours of discharge to confirm appointment and review transition of care protocol questionnaire. Anticipated discharge date: 12/03/22  Perlie Gold, PA-C  12/03/2022, 1:03 PM

## 2022-12-03 NOTE — Progress Notes (Signed)
  Echocardiogram 2D Echocardiogram has been performed.  Sarah Vargas 12/03/2022, 3:16 PM

## 2022-12-03 NOTE — TOC CM/SW Note (Signed)
Transition of Care [facility]) - Inpatient Brief Assessment   Patient Details  Name: Sarah Vargas MRN: [id number] Date of Birth: June 24, 1960  Transition of Care [facility]) CM/SW Contact:    Gala Lewandowsky, RN Phone Number: 12/03/2022, 12:10 PM   Clinical Narrative: Patient presented for Stemi. Patient has insurance and PCP. Benefits check has been submitted for Brilinta. No further needs identified at this time.    Transition of Care Asessment: Insurance and Status: Insurance coverage has been reviewed Patient has primary care physician: Yes Prior/Current Home Services: No current home services Social Determinants of Health Reivew: SDOH reviewed no interventions necessary Readmission risk has been reviewed: Yes Transition of care needs: no transition of care needs at this time

## 2022-12-03 NOTE — TOC Benefit Eligibility Note (Signed)
Patient Product/process development scientist completed.    The patient is insured through [facility] MEDICAID.     Ran test claim for Brilinta 90 mg and the current 30 day co-pay is $4.00.   This test claim was processed through [facility]- copay amounts may vary at other pharmacies due to pharmacy/plan contracts, or as the patient moves through the different stages of their insurance plan.     Sarah Vargas, CPHT Pharmacy Technician III Certified Patient Advocate [facility] Patient Advocate Team Direct Number: [phone]  Fax: [phone]

## 2022-12-03 NOTE — Progress Notes (Signed)
Patient discharged home at on 12/03/2022 at 1811 to discharge lounge. Discharge instructions were provided and all questions were answered. All belongings were returned and patient is satisfied. Vital signs stable.   Naylene Foell [employer] RN

## 2022-12-03 NOTE — Progress Notes (Signed)
CARDIAC REHAB PHASE I   PRE:  Rate/Rhythm: 61 SR    BP: sitting 100/53    SpO2: 99 RA  MODE:  Ambulation: 370 ft   POST:  Rate/Rhythm: 63 SR    BP: sitting 103/65     SpO2: 99 RA  Pt to BR then ambulated hall. No c/o, tolerated well. To recliner.  Discussed  with pt MI, stent, Brilinta importance, smoking cessation, diet, exercise, NTG, and CRPII. Pt receptive. She wants to quit smoking. Resources given. Will refer to [facility] CRPII. She has transportation through her insurance.  [id number]  Ethelda Chick BS, ACSM-CEP 12/03/2022 11:27 AM

## 2022-12-04 NOTE — Telephone Encounter (Signed)
Patient contacted regarding discharge from [facility] on 12/03/22.  Patient understands to follow up with provider J. Cleaver on 12/09/22  at 10:05 at [facility] Patient understands discharge instructions? Yes Patient understands medications and regiment? Yes Patient understands to bring all medications to this visit? Yes  Ask patient:  Are you enrolled in My Chart , Yes               Do you have any questions about your medications?  All medications (except pain medications) are to be filled by your Cardiologist AFTER your first post op       appointment with them.  Are you taking your pain medication? No questions, no pain              How is your pain controlled? Pain level? No pain              If you require a refill on pain medications, know that the same medication/ amount may not be prescribed or a refill may not be given.  Please contact your pharmacy for refill requests.               Do you have help at home with ADL's?  If you have home health, have you been contacted or seen by the agency? None needed

## 2022-12-04 NOTE — Telephone Encounter (Signed)
Called patient----LM to call office

## 2022-12-04 NOTE — Progress Notes (Signed)
  Progress note from 9/25 to act as discharge summary. Accidentally labeled as a progress note.  Perlie Gold, PA-C

## 2022-12-05 LAB — LIPOPROTEIN A (LPA): Lipoprotein (a): 16 nmol/L (ref <75.0)

## 2022-12-07 NOTE — Progress Notes (Unsigned)
Cardiology Clinic Note   Patient Name: CAMDYNN MARANTO Date of Encounter: 12/07/2022  Primary Care Provider:  Rebecka Apley, NP Primary Cardiologist:  None  Patient Profile    Tyquisha DANISSA RUNDLE 62 year old female presents the clinic today for follow-up evaluation of her coronary artery disease status post STEMI with PCI and DES to her first diagonal.  Past Medical History    Past Medical History:  Diagnosis Date   Back pain    Chest pain    12/2010.   Fibromyalgia    Generalized headaches    GERD (gastroesophageal reflux disease) 12/10/2010   HTN (hypertension) 12/10/2010   Kidney stones    s/p ureteral stents-removed in past.   LBP (low back pain) 12/10/2010   Osteoarthritis    Positive H. pylori test 12/10/2010   Treated 2011.   STEMI (ST elevation myocardial infarction) (HCC) 12/02/2022   Tobacco abuse 12/10/2010   Past Surgical History:  Procedure Laterality Date   CORONARY/GRAFT ACUTE MI REVASCULARIZATION N/A 12/02/2022   Procedure: Coronary/Graft Acute MI Revascularization;  Surgeon: Tonny Bollman, MD;  Location: [facility] INVASIVE CV LAB;  Service: Cardiovascular;  Laterality: N/A;   LEFT HEART CATH AND CORONARY ANGIOGRAPHY N/A 12/02/2022   Procedure: LEFT HEART CATH AND CORONARY ANGIOGRAPHY;  Surgeon: Tonny Bollman, MD;  Location: [facility] INVASIVE CV LAB;  Service: Cardiovascular;  Laterality: N/A;   SALPINGOOPHORECTOMY     For hx of tubal pregnancy   TUBAL LIGATION     URETERAL STENT PLACEMENT      Allergies  No Known Allergies  History of Present Illness    Cabria L Crusoe has a PMH of coronary artery disease, tobacco use, HTN, HLD, and chest pain.  She presented to the emergency department on 12/02/2022 and was discharged on 12/03/2022.  She reported sharp midsternal chest pain with no radiation.  She indicated that her pain had worsened around 9 PM with exertion.  She contacted EMS.  She did report 1 previous episode of chest discomfort when she was diagnosed  with hypertension.  She reported cardiac catheterization previously that did not show coronary blockages.  No records of cardiac catheterization were available.  She underwent stress testing in 2015 which showed no evidence of ischemia and 77% max predicted heart rate.  She presented as a code STEMI.  She was taken emergently to the cardiac Cath Lab.  She was noted to have a first diagonal branch 100% stenosed lesion.  She received PCI with DES x 1.  Her EF was noted to be 55-60% and her diastolic parameters were normal.  She was placed on aspirin and Brilinta x 12 months.  Recommendation for lifelong ASA was made.  She was continued on metoprolol which was reduced to 12.5 mg due to low blood pressure.  She was transition from pravastatin to rosuvastatin 40 mg and cardiac rehab was recommended.  Tobacco cessation was strongly encouraged.  She presents to the clinic today for follow-up evaluation and states***.  *** denies chest pain, shortness of breath, lower extremity edema, fatigue, palpitations, melena, hematuria, hemoptysis, diaphoresis, weakness, presyncope, syncope, orthopnea, and PND.  STEMI-denies chest pain since being discharged from the hospital.  Admitted with anterior lateral STEMI on 12/02/2022.  Underwent LHC with PCI and DES to her first diagonal.  She was also noted to have 40-50% mid LAD stenosis.  Echocardiogram showed preserved EF. Continue aspirin, Brilinta, rosuvastatin, metoprolol Heart healthy low-sodium diet Increase physical activity as tolerated  Essential hypertension-BP today***. Continue current medical therapy  Increase physical activity as tolerated Maintain blood pressure log  Hyperlipidemia-LDL***.  Transition from pravastatin to rosuvastatin Plan for repeat fasting lipids and LFTs in 6-8 weeks High-fiber diet  Disposition: Follow-up with Dr. Gasper Lloyd or me in 3-4 months.   Home Medications    Prior to Admission medications   Medication Sig Start Date End  Date Taking? Authorizing Provider  alendronate (FOSAMAX) 70 MG tablet Take 70 mg by mouth once a week. Take on empty stomach with full glass of water, sit upright for 1 hour after. 10/06/22   [provider]  aspirin EC 81 MG tablet Take 1 tablet (81 mg total) by mouth daily. Swallow whole. 12/04/22   Perlie Gold, PA-C  baclofen (LIORESAL) 10 MG tablet Take 10 mg by mouth 3 (three) times daily.    [provider]  Calcium Carb-Cholecalciferol (CALCIUM 600 + D PO) Take 1 tablet by mouth in the morning and at bedtime.    [provider]  DULoxetine (CYMBALTA) 30 MG capsule Take 30 mg by mouth See admin instructions. Take with Duloxetine 60mg  to equal 90mg  total. 02/08/18   [provider]  DULoxetine (CYMBALTA) 60 MG capsule Take 60 mg by mouth See admin instructions. Take with Duloxetine 30mg  to equal 90mg  total. 02/08/18   [provider]  loratadine (CLARITIN) 10 MG tablet Take 10 mg by mouth daily.    [provider]  metoprolol succinate (TOPROL-XL) 25 MG 24 hr tablet Take 0.5 tablets (12.5 mg total) by mouth daily. 12/04/22   Perlie Gold, PA-C  Multiple Vitamin (MULTIVITAMIN) tablet Take 1 tablet by mouth daily.    [provider]  nitroGLYCERIN (NITROSTAT) 0.4 MG SL tablet Place 1 tablet (0.4 mg total) under the tongue every 5 (five) minutes as needed for chest pain. 12/03/22 12/03/23  Perlie Gold, PA-C  oxybutynin (DITROPAN) 5 MG tablet Take 5 mg by mouth daily.    [provider]  pantoprazole (PROTONIX) 40 MG tablet Take 1 tablet (40 mg total) by mouth daily. 12/04/22   Perlie Gold, PA-C  rosuvastatin (CRESTOR) 40 MG tablet Take 1 tablet (40 mg total) by mouth daily. 12/04/22   Perlie Gold, PA-C  ticagrelor (BRILINTA) 90 MG TABS tablet Take 1 tablet (90 mg total) by mouth 2 (two) times daily. 12/03/22   Perlie Gold, PA-C  topiramate (TOPAMAX) 50 MG tablet Take 50 mg by mouth 2 (two) times daily.    [provider]  traZODone (DESYREL) 150 MG tablet Take 150 mg by mouth at bedtime.    [provider]  triamcinolone ointment (KENALOG) 0.5 % Apply 1 Application topically 2 (two) times daily. 10/29/22 10/29/23  [provider]    Family History    Family History  Problem Relation Age of Onset   Heart attack Father    Hypertension Father    Heart attack Mother    Hypertension Mother    She indicated that the status of her mother is unknown. She indicated that the status of her father is unknown.  Social History    Social History   Socioeconomic History   Marital status: Divorced    Spouse name: Not on file   Number of children: 1   Years of education: Not on file   Highest education level: Not on file  Occupational History    Comment: Unemployed  Tobacco Use   Smoking status: Every Day    Current packs/day: 0.50    Average packs/day: 0.5 packs/day for 30.0 years (15.0  ttl pk-yrs)    Types: Cigarettes   Smokeless tobacco: Never  Vaping Use   Vaping status: Every Day   Substances: Nicotine, Flavoring  Substance and Sexual Activity   Alcohol use: Yes    Comment: occas   Drug use: No   Sexual activity: Not on file  Other Topics Concern   Not on file  Social History Narrative   Lives in [place]   Unemployed   Divorced   Social Determinants of Health   Financial Resource Strain: Medium Risk (07/02/2022)   Received from [facility]   Overall Financial Resource Strain (CARDIA)    Difficulty of Paying Living Expenses: Somewhat hard  Food Insecurity: No Food Insecurity (12/03/2022)   Hunger Vital Sign    Worried About Running Out of Food in the Last Year: Never true    Ran Out of Food in the Last Year: Never true  Transportation Needs: No Transportation Needs (12/03/2022)   PRAPARE - Administrator, Civil Service (Medical): No    Lack of Transportation (Non-Medical): No  Physical Activity: Unknown (07/02/2022)   Received from [facility]   Exercise Vital Sign    Days of Exercise per Week: Patient declined    Minutes of Exercise per Session: Not on file  Stress: Stress Concern Present (07/02/2022)   Received from [facility] - Occupational Stress Questionnaire    Feeling of Stress : To some extent  Social Connections: Socially Integrated (07/02/2022)   Received from [facility]   Social Network    How would you rate your social network (family, work, friends)?: Good participation with social networks  Intimate Partner Violence: Not At Risk (12/03/2022)   Humiliation, Afraid, Rape, and Kick questionnaire    Fear of Current or Ex-Partner: No    Emotionally Abused: No    Physically Abused: No    Sexually Abused: No     Review of Systems    General:  No chills, fever, night sweats or weight changes.  Cardiovascular:  No chest pain, dyspnea on exertion, edema, orthopnea, palpitations, paroxysmal nocturnal dyspnea. Dermatological: No rash, lesions/masses Respiratory: No cough, dyspnea Urologic: No hematuria, dysuria Abdominal:   No nausea, vomiting, diarrhea, bright red blood per rectum, melena, or hematemesis Neurologic:  No visual changes, wkns, changes in mental status. All other systems reviewed and are otherwise negative except as noted above.  Physical Exam    VS:  There were no vitals taken for this visit. , BMI There is no height or weight on file to calculate BMI. GEN: Well nourished, well developed, in no acute distress. HEENT: normal. Neck: Supple, no JVD, carotid bruits, or masses. Cardiac: RRR, no murmurs, rubs, or gallops. No clubbing, cyanosis, edema.  Radials/DP/PT 2+ and equal bilaterally.  Respiratory:  Respirations regular and unlabored, clear to auscultation bilaterally. GI: Soft, nontender, nondistended, BS + x 4. MS: no deformity or atrophy. Skin: warm and dry, no rash. Neuro:  Strength and sensation are intact. Psych: Normal  affect.  Accessory Clinical Findings    Recent Labs: 12/02/2022: ALT 22; BUN 33; Creatinine, Ser 1.07; Hemoglobin 12.8; Platelets 275; Potassium 4.6; Sodium 136   Recent Lipid Panel    Component Value Date/Time   CHOL 154 12/02/2022 2257   TRIG 101 12/02/2022 2257   HDL 54 12/02/2022 2257   CHOLHDL 2.9 12/02/2022 2257   VLDL 20 12/02/2022 2257   LDLCALC 80 12/02/2022 2257    No BP recorded.  {  Refresh Note OR Click here to enter BP  :1}***    ECG personally reviewed by me today- ***    Echocardiogram 12/03/2022  IMPRESSIONS     1. Left ventricular ejection fraction, by estimation, is 55 to 60%. The  left ventricle has normal function. The left ventricle has no regional  wall motion abnormalities. Left ventricular diastolic parameters were  normal.   2. Right ventricular systolic function is normal. The right ventricular  size is normal. There is normal pulmonary artery systolic pressure. The  estimated right ventricular systolic pressure is 29.7 mmHg.   3. The mitral valve is normal in structure. Trivial mitral valve  regurgitation. No evidence of mitral stenosis.   4. The aortic valve is normal in structure. Aortic valve regurgitation is  not visualized. No aortic stenosis is present.   5. The inferior vena cava is normal in size with <50% respiratory  variability, suggesting right atrial pressure of 8 mmHg.   FINDINGS   Left Ventricle: Left ventricular ejection fraction, by estimation, is 55  to 60%. The left ventricle has normal function. The left ventricle has no  regional wall motion abnormalities. The left ventricular internal cavity  size was normal in size. There is   no left ventricular hypertrophy. Left ventricular diastolic parameters  were normal. Normal left ventricular filling pressure.   Right Ventricle: The right ventricular size is normal. No increase in  right ventricular wall thickness. Right ventricular systolic function is  normal. There is normal  pulmonary artery systolic pressure. The tricuspid  regurgitant velocity is 2.33 m/s, and   with an assumed right atrial pressure of 8 mmHg, the estimated right  ventricular systolic pressure is 29.7 mmHg.   Left Atrium: Left atrial size was normal in size.   Right Atrium: Right atrial size was normal in size.   Pericardium: There is no evidence of pericardial effusion.   Mitral Valve: The mitral valve is normal in structure. Trivial mitral  valve regurgitation. No evidence of mitral valve stenosis.   Tricuspid Valve: The tricuspid valve is normal in structure. Tricuspid  valve regurgitation is trivial. No evidence of tricuspid stenosis.   Aortic Valve: The aortic valve is normal in structure. Aortic valve  regurgitation is not visualized. No aortic stenosis is present.   Pulmonic Valve: The pulmonic valve was normal in structure. Pulmonic valve  regurgitation is not visualized. No evidence of pulmonic stenosis.   Aorta: The aortic root is normal in size and structure.   Venous: The inferior vena cava is normal in size with less than 50%  respiratory variability, suggesting right atrial pressure of 8 mmHg.   IAS/Shunts: No atrial level shunt detected by color flow Doppler.    LHC 12/02/2022   1.  Acute lateral wall MI secondary to total occlusion of first diagonal branch, treated with primary PCI using a 2.25 x 16 mm Synergy DES, reducing 100% stenosis to 0%.  TIMI 0 flow at baseline improved to TIMI-3 post PCI.  There is mild to moderate nonobstructive plaquing proximal to the stented segment of the diagonal.   2.  Nonobstructive mid LAD stenosis estimated at 40 to 50% 3.  Patent left main, left circumflex, and RCA with mild irregularities 4.  Mild hypokinesis of the anterolateral wall with preserved overall LVEF estimated at 55 to 65%   Recommend: CV-ICU care, post MI medical therapy, DAPT with aspirin and ticagrelor x 12 months without interruption, patient may be a candidate  for early post MI discharge  pending her clinical course. Diagnostic Dominance: Right  Intervention      Assessment & Plan   1.  ***   Thomasene Ripple. Dayon Witt NP-C     12/07/2022, 11:40 AM [facility] [address] Office [phone] Fax [phone]    I spent***minutes examining this patient, reviewing medications, and using patient centered shared decision making involving her cardiac care.  Prior to her visit I spent greater than 20 minutes reviewing her past medical history,  medications, and prior cardiac tests.

## 2022-12-09 ENCOUNTER — Ambulatory Visit: Payer: Medicaid Other | Attending: General Practice | Admitting: General Practice

## 2022-12-09 ENCOUNTER — Encounter: Payer: Self-pay | Admitting: General Practice

## 2022-12-09 VITALS — BP 100/60 | HR 63 | Ht 65.0 in | Wt 143.0 lb

## 2022-12-09 DIAGNOSIS — I10 Essential (primary) hypertension: Secondary | ICD-10-CM

## 2022-12-09 DIAGNOSIS — I213 ST elevation (STEMI) myocardial infarction of unspecified site: Secondary | ICD-10-CM | POA: Diagnosis not present

## 2022-12-09 DIAGNOSIS — E782 Mixed hyperlipidemia: Secondary | ICD-10-CM | POA: Diagnosis not present

## 2022-12-09 NOTE — Patient Instructions (Addendum)
Medication Instructions:  The current medical regimen is effective;  continue present plan and medications as directed. Please refer to the Current Medication list given to you today.  *If you need a refill on your cardiac medications before your next appointment, please call your pharmacy*  Lab Work: FASTING LIPID AND LFT IN 6-8 WEEKS If you have labs (blood work) drawn today and your tests are completely normal, you will receive your results only by: MyChart Message (if you have MyChart) OR  A paper copy in the mail If you have any lab test that is abnormal or we need to change your treatment, we will call you to review the results.  Other Instructions OK FOR CARDIAC REHAB SEE STRESS REDUCTION TIPS ATTACHED SEE ATTACHED SLEEP TIPS PLEASE READ AND FOLLOW ATTACHED  SALTY 6  PLEASE READ ATTACHED COMPRESSION STOCKINGS-DOE NOT WEAR OVERNIGHT  Follow-Up: At [facility], you and your health needs are our priority.  As part of our continuing mission to provide you with exceptional heart care, we have created designated Provider Care Teams.  These Care Teams include your primary Cardiologist (physician) and Advanced Practice Providers (APPs -  Physician Assistants and Nurse Practitioners) who all work together to provide you with the care you need, when you need it.  We recommend signing up for the patient portal called "MyChart".  Sign up information is provided on this After Visit Summary.  MyChart is used to connect with patients for Virtual Visits (Telemedicine).  Patients are able to view lab/test results, encounter notes, upcoming appointments, etc.  Non-urgent messages can be sent to your provider as well.   To learn more about what you can do with MyChart, go to [email or web address].    Your next appointment:   3 month(s)  Provider:   Tonny Bollman, MD  or Edd Fabian, FNP              Mindfulness-Based Stress Reduction Mindfulness-based stress reduction (MBSR)  is a program that helps people learn to practice mindfulness. Mindfulness is the practice of consciously paying attention to the present moment. MBSR focuses on developing self-awareness, which lets you respond to life stress without judgment or negative feelings. It can be learned and practiced through techniques such as education, breathing exercises, meditation, and yoga. MBSR includes several mindfulness techniques in one program. MBSR works best when you understand the treatment, are willing to try new things, and can commit to spending time practicing what you learn. MBSR training may include learning about: How your feelings, thoughts, and reactions affect your body. New ways to respond to things that cause negative thoughts to start (triggers). How to notice your thoughts and let go of them. Practicing awareness of everyday things that you normally do without thinking. The techniques and goals of different types of meditation. What are the benefits of MBSR? MBSR can have many benefits, which include helping you to: Develop self-awareness. This means knowing and understanding yourself. Learn skills and attitudes that help you to take part in your own health care. Learn new ways to care for yourself. Be more accepting about how things are, and let things go. Be less judgmental and approach things with an open mind. Be patient with yourself and trust yourself more. MBSR has also been shown to: Reduce negative emotions, such as sadness, overwhelm, and worry. Improve memory and focus. Change how you sense and react to pain. Boost your body's ability to fight infections. Help you connect better with other people. Improve your sense  of well-being. How to practice mindfulness To do a basic awareness exercise: Find a comfortable place to sit. Pay attention to the present moment. Notice your thoughts, feelings, and surroundings just as they are. Avoid judging yourself, your feelings, or your  surroundings. Make note of any judgment that comes up and let it go. Your mind may wander, and that is okay. Make note of when your thoughts drift, and return your attention to the present moment. To do basic mindfulness meditation: Find a comfortable place to sit. This may include a stable chair or a firm floor cushion. Sit upright with your back straight. Let your arms fall next to your sides, with your hands resting on your legs. If you are sitting in a chair, rest your feet flat on the floor. If you are sitting on a cushion, cross your legs in front of you. Keep your head in a neutral position with your chin dropped slightly. Relax your jaw and rest the tip of your tongue on the roof of your mouth. Drop your gaze to the floor or close your eyes. Breathe normally and pay attention to your breath. Feel the air moving in and out of your nose. Feel your belly expanding and relaxing with each breath. Your mind may wander, and that is okay. Make note of when your thoughts drift, and return your attention to your breath. Avoid judging yourself, your feelings, or your surroundings. Make note of any judgment or feelings that come up, let them go, and bring your attention back to your breath. When you are ready, lift your gaze or open your eyes. Pay attention to how your body feels after the meditation. Follow these instructions at home:  Find a local in-person or online MBSR program. Set aside some time regularly for mindfulness practice. Practice every day if you can. Even 10 minutes of practice is helpful. Find a mindfulness practice that works best for you. This may include one or more of the following: Meditation. This involves focusing your mind on a certain thought or activity. Breathing awareness exercises. These help you to stay present by focusing on your breath. Body scan. For this practice, you lie down and pay attention to each part of your body from head to toe. You can identify tension  and soreness and consciously relax parts of your body. Yoga. Yoga involves stretching and breathing, and it can improve your ability to move and be flexible. It can also help you to test your body's limits, which can help you release stress. Mindful eating. This way of eating involves focusing on the taste, texture, color, and smell of each bite of food. This slows down eating and helps you feel full sooner. For this reason, it can be an important part of a weight loss plan. Find a podcast or recording that provides guidance for breathing awareness, body scan, or meditation exercises. You can listen to these any time when you have a free moment to rest without distractions. Follow your treatment plan as told by your health care provider. This may include taking regular medicines and making changes to your diet or lifestyle as recommended. Where to find more information You can find more information about MBSR from: Your health care provider. Community-based meditation centers or programs. Programs offered near you. Summary Mindfulness-based stress reduction (MBSR) is a program that teaches you how to consciously pay attention to the present moment. It is used to help you deal better with daily stress, feelings, and pain. MBSR focuses on  developing self-awareness, which allows you to respond to life stress without judgment or negative feelings. MBSR programs may involve learning different mindfulness practices, such as breathing exercises, meditation, yoga, body scan, or mindful eating. Find a mindfulness practice that works best for you, and set aside time for it on a regular basis. This information is not intended to replace advice given to you by your health care provider. Make sure you discuss any questions you have with your health care provider. Document Revised: 10/04/2020 Document Reviewed: 10/04/2020 Elsevier Patient Education  2024 [employer].   Copywriter, [employer]  Journalist, newspaper for [employer] Mailing Address:  [address];   [address]  [place], [place] [address]  Tel [phone] Fx [phone]     High Quality Legwear for Today's Active Lifestyles Maximum Compression at the ankle. Compression lessens gradually up the leg.   We manufacture a wide range of compression hosiery for men and women in  different styles, constructions and levels of support.  How Compression Hosiery Works Regulatory affairs officer, [employer]. compression hosiery works by applying graduated pressure to the  muscles and veins in the legs.  When the calf muscle contracts such as during walking  the compression hosiery will "give" and then return to its original position. By doing so  the hosiery is assists your body's circulatory wellness.  The result is increased leg health and vitality.   Maximum Compression at the ankle Compression lessens gradually up the leg  We Offer: Sheer & Opaque Stockings       COLORS:  Nude, black, white and misc. prints Below Knee Thigh High Pantyhose  High Quality Legwear for Today's Active Lifestyles We manufacture a wide range of compression hosiery for men and women in different styles, construction sand levels of support.  Socks:                     Sheer & Opaque   Compression Levels Include:                                  Stockings Men's               Below Knee                8-15 mmHg   Women's         Thigh High                 15-20 mmHg  Unisex             Pantyhose                 20-30 mmHg                                                                      30-40 mmHg  4 Simple Ways to Order   Email  [email or web address] Mail/Email orders are subject to processing and handling charges. Allow 7-10 days for receipt.  Phone [phone]  Please allow 24 hours for return call.   In Person  We recommend calling prior to your visit to confirm store  hours as they may change due to holiday, weather, and  maintenance.   By Mail When placing an order, please have the following information available. Our representatives are available to assist.     Measurements    THIGH      in.   CALF        in.   ANKLE     in.    Compression  8-15 mmHg >>15-20 mmHg<<   20-30 mmHg 30-40 mmHg   WOMEN'S MEN'S  Shoe Size Sock Size Shoe Size Sock Size  4 - 5 Small 7.5 and Under Small  5.5 - 7.5 Medium 8 - 10 Medium  8 - 10 Large 10.5 - 12 Large  10.5 and Over X-Large 12.5 and Over X-Large   Knee High Size Chart  Length from CALF MEASUREMENT  floor to bend   in knee. 11" 12" 13" 14" 15" 16" 17" 18" 19" 20" 21" 22"  14" S S S S M M L L L XL XL XL  15" S S S M M L L L XL XL XXL XXL  16" S S M M M L L L XL XXL XXL XXL  17" S M M M M L L XL XL Randa Lynn XXL  18" M M M M L L L XL XL XXL XXL XXL  19" M M M M L L XL XL XL XXL XXL XXL   Thigh High Circumference Sizing Chart                 S M L XL XXL  ANKLE 6.5" - 8" 8" - 9.5" 9.5" - 11" 11" - 12.5" 12.5" - 14"  CALF 10.5" - 14.5" 11.5" - 15.5" 12.5" - 17" 13.5" - 17.5" 14.5" - 19.5"  THIGH 15.5" - 22" 17.5" - 24" 19.5" - 26" 22" - 28" 26" - 32"  HIP UP TO 40" UP TO 44" UP TO 48' UP TO 52" UP TO 56"   Pantyhose Size Chart  Height Petite Medium Tall X-Tall [place] +   Weight Weight Weight Weight Weight Weight  4'11" 95-130 135      5'0 95-125 130-145   170-185   5'1" 90-120 125-155 160-165  170-195   5'2" 90-115 120-145 150-165  170-195   5'3" 90-110 115-140 145-165  170-200 200-225  5'4" 100-105 110-135 140-160 165 170-200 200-225  5'5" 100 105-130 135-160 165 170-200 200-225  5'6"  110-125 130-155 160-165 170-200 200-225  5'7"  110-120 125-150 155-165 170-200 195-225  5'8"   120-145 150-165 170-200 190-225  5'9"   125-140 145-170 175-190 185-220  5'10"   125-135 140-185  185-215  5'11"   130-135 140-185  190-210      How to Use Compression Stockings  Compression stockings are elastic socks that help increase blood flow  (circulation) to the legs, decrease swelling in the legs, and reduce the chance of developing blood clots in the lower legs. Compression stockings squeeze or apply pressure to the legs. The stockings are graduated, meaning the highest amount of pressure occurs at the toes and it decreases going toward the upper part of the leg. This helps ensure proper circulation through the veins. Compression stockings are often used by people who: Are recovering from surgery. The stockings help prevent blood clots after surgery. Have poor circulation or swelling in their legs because of a medical condition, such as chronic venous insufficiency, venous stasis, or lymphedema. Have a history of getting blood  clots in their legs. Have bulging (varicose) veins. Sit or stay in bed for long periods of time (immobilization). Stand for long periods of time and experience leg pain or fatigue. Follow instructions from your health care provider about how and when to wear your compression stockings. What are the risks? Generally, compression stockings are safe to wear. However, problems may occur for some people, such as: The stockings being ineffective at increasing the circulation to the legs, decreasing swelling in the legs, or reducing the chance of developing blood clots in the lower legs. Skin complications, including breaks in the skin, open wounds, blisters, or dermatitis. How to wear compression stockings Before you put on your compression stockings: Make sure that they are the correct size and degree of compression. If you do not know your size or required grade of compression, ask your health care provider and follow the manufacturer's instructions that come with the stockings. Be sure they are the appropriate length for your medical needs. Compression stockings come in different lengths, including knee high, thigh high, and even up to the waist. Make sure that the stockings are clean, dry, and in good  condition. Check the stockings for rips and tears. Do not put them on if they are ripped or torn. Put your stockings on first thing in the morning, before you get out of bed. Keep them on for as long as your health care provider advises. Most people are told to remove their compression stockings at the end of the day before bed. When you are wearing your stockings: Keep them as smooth as possible. Do not allow them to bunch up. It is especially important to prevent the stockings from bunching up around your toes or behind your knees. Make sure that the toe holes are underneath the toes and the heel patches are positioned at the heels. Do not roll the stockings downward and leave them rolled down. This can decrease blood flow to your legs. Change the stockings right away if they become wet or dirty or if they have a bad smell. If you have chronic leg wounds, make sure the wounds are properly covered or dressed before putting on your compression stockings. When you take off your stockings, check your legs and feet for: Open sores. Red spots or other areas of discoloration. Swelling. General tips Do not stop wearing compression stockings. Talk to your health care provider if your stockings feel too tight. Wash your stockings often with mild detergent in cold or warm water. Also wash them whenever they get dirty or have a bad smell. Do not use bleach. Air-dry your stockings or dry them in a clothes dryer on low heat. It may be helpful to have two pairs so that you have a pair to wear while the other is being washed. Replace your stockings every 3-6 months. If skin moisturizing is part of your treatment plan, apply lotion or cream at night so that your skin will be dry when you put on the stockings in the morning. It is harder to put the stockings on when you have lotion on your legs or feet. Wear nonskid shoes or slip-resistant socks when walking while wearing compression stockings. If you have  difficulty putting on or taking off the compression stockings, ask your health care provider about devices that may help make this easier. Contact a health care provider and remove your stockings if: You have a prickling or tingling feeling in your feet or legs. You have new open sores, red  spots, or other skin changes on your feet or legs. You have swelling or pain that gets worse. Get help right away if: You have shortness of breath or chest pain. Your heartbeat is fast or irregular. You have new swelling, pain, or warmth in your leg. You have numbness or tingling in your lower legs that does not get better after you take the stockings off. Your toes or feet are unusually cold or turn a bluish color. You feel light-headed or dizzy. These symptoms may represent a serious problem that is an emergency. Do not wait to see if the symptoms will go away. Get medical help right away. Call your local emergency services (911 in the [place] Do not drive yourself to the hospital. Summary Compression stockings are elastic socks that are worn to treat a variety of symptoms and medical conditions such as venous insufficiency, venous stasis, or lymphedema. Compression stockings help increase blood flow (circulation) to the legs, decrease swelling in the legs, and reduce the chance of developing blood clots in the lower legs. Follow instructions from your health care provider about how and when to wear your compression stockings. Do not stop wearing your compression stockings without talking to your health care provider first. This information is not intended to replace advice given to you by your health care provider. Make sure you discuss any questions you have with your health care provider. Document Revised: 08/16/2020 Document Reviewed: 08/16/2020 Elsevier Patient Education  2024 [employer].

## 2022-12-11 ENCOUNTER — Encounter (HOSPITAL_COMMUNITY): Payer: Medicaid Other

## 2022-12-15 ENCOUNTER — Encounter (HOSPITAL_COMMUNITY)
Admission: RE | Admit: 2022-12-15 | Discharge: 2022-12-15 | Disposition: A | Discharge status: Home / Self Care | Payer: Medicaid Other | Source: Ambulatory Visit | Attending: Cardiovascular Disease | Admitting: Cardiovascular Disease

## 2022-12-15 ENCOUNTER — Encounter (HOSPITAL_COMMUNITY): Payer: Self-pay

## 2022-12-15 DIAGNOSIS — I2129 ST elevation (STEMI) myocardial infarction involving other sites: Secondary | ICD-10-CM | POA: Insufficient documentation

## 2022-12-15 DIAGNOSIS — Z955 Presence of coronary angioplasty implant and graft: Secondary | ICD-10-CM | POA: Insufficient documentation

## 2022-12-15 NOTE — Progress Notes (Signed)
Completed virtual orientation today.  EP evaluation is scheduled for Wednesday 12/17/22 at 1400 .  Documentation for diagnosis can be found in [facility] encounter 12/02/22.  Marylouise has recently quit tobacco use within the last 6 months. Intervention for relapse prevention was provided at the initial medical review. He was encouraged to continue to with tobacco cessation and was provided information on relapse prevention. Patient will receive information about combination therapy, tobacco cessation classes, quit line, and quit smoking apps in case of a relapse. Patient will demonstrate understanding of this material.Staff will continue to provide encouragement and follow up with the patient throughout the program.

## 2022-12-17 ENCOUNTER — Encounter (HOSPITAL_COMMUNITY)
Admission: RE | Admit: 2022-12-17 | Discharge: 2022-12-17 | Disposition: A | Discharge status: Home / Self Care | Payer: Medicaid Other | Source: Ambulatory Visit | Attending: Cardiovascular Disease | Admitting: Cardiovascular Disease

## 2022-12-17 VITALS — Ht 64.0 in | Wt 138.9 lb

## 2022-12-17 DIAGNOSIS — Z955 Presence of coronary angioplasty implant and graft: Secondary | ICD-10-CM

## 2022-12-17 DIAGNOSIS — I2129 ST elevation (STEMI) myocardial infarction involving other sites: Secondary | ICD-10-CM

## 2022-12-17 NOTE — Progress Notes (Signed)
Cardiac Individual Treatment Plan  Patient Details  Name: Sarah Vargas MRN: [id number] Date of Birth: 10-12-1960 Referring Provider:   Flowsheet Row CARDIAC REHAB PHASE II ORIENTATION from 12/17/2022 in [facility] CARDIAC REHABILITATION  Referring Provider Tonny Bollman MD       Initial Encounter Date:  Flowsheet Row CARDIAC REHAB PHASE II ORIENTATION from 12/17/2022 in [place] CARDIAC REHABILITATION  Date 12/17/22       Visit Diagnosis: ST elevation myocardial infarction (STEMI) involving other coronary artery [facility])  Status post coronary artery stent placement  Patient's Home Medications on Admission:  Current Outpatient Medications:    alendronate (FOSAMAX) 70 MG tablet, Take 70 mg by mouth once a week. Take on empty stomach with full glass of water, sit upright for 1 hour after., Disp: , Rfl:    aspirin EC 81 MG tablet, Take 1 tablet (81 mg total) by mouth daily. Swallow whole., Disp: 120 tablet, Rfl: 12   baclofen (LIORESAL) 10 MG tablet, Take 10 mg by mouth 3 (three) times daily., Disp: , Rfl:    Calcium Carb-Cholecalciferol (CALCIUM 600 + D PO), Take 1 tablet by mouth in the morning and at bedtime., Disp: , Rfl:    DULoxetine (CYMBALTA) 30 MG capsule, Take 30 mg by mouth See admin instructions. Take with Duloxetine 60mg  to equal 90mg  total., Disp: , Rfl:    DULoxetine (CYMBALTA) 60 MG capsule, Take 60 mg by mouth See admin instructions. Take with Duloxetine 30mg  to equal 90mg  total., Disp: , Rfl:    loratadine (CLARITIN) 10 MG tablet, Take 10 mg by mouth daily., Disp: , Rfl:    metoprolol succinate (TOPROL-XL) 25 MG 24 hr tablet, Take 0.5 tablets (12.5 mg total) by mouth daily., Disp: 45 tablet, Rfl: 0   Multiple Vitamin (MULTIVITAMIN) tablet, Take 1 tablet by mouth daily., Disp: , Rfl:    nitroGLYCERIN (NITROSTAT) 0.4 MG SL tablet, Place 1 tablet (0.4 mg total) under the tongue every 5 (five) minutes as needed for chest pain., Disp: 25 tablet, Rfl: 3   pantoprazole  (PROTONIX) 40 MG tablet, Take 1 tablet (40 mg total) by mouth daily., Disp: 90 tablet, Rfl: 1   rosuvastatin (CRESTOR) 40 MG tablet, Take 1 tablet (40 mg total) by mouth daily., Disp: 90 tablet, Rfl: 1   ticagrelor (BRILINTA) 90 MG TABS tablet, Take 1 tablet (90 mg total) by mouth 2 (two) times daily., Disp: 180 tablet, Rfl: 3   topiramate (TOPAMAX) 50 MG tablet, Take 50 mg by mouth 2 (two) times daily., Disp: , Rfl:    traZODone (DESYREL) 150 MG tablet, Take 150 mg by mouth at bedtime., Disp: , Rfl:    triamcinolone ointment (KENALOG) 0.5 %, Apply 1 Application topically 2 (two) times daily., Disp: , Rfl:    oxybutynin (DITROPAN) 5 MG tablet, Take 5 mg by mouth daily. (Patient not taking: Reported on 12/17/2022), Disp: , Rfl:   Past Medical History: Past Medical History:  Diagnosis Date   Back pain    Chest pain    12/2010.   Fibromyalgia    Generalized headaches    GERD (gastroesophageal reflux disease) 12/10/2010   HTN (hypertension) 12/10/2010   Kidney stones    s/p ureteral stents-removed in past.   LBP (low back pain) 12/10/2010   Osteoarthritis    Positive H. pylori test 12/10/2010   Treated 2011.   STEMI (ST elevation myocardial infarction) (HCC) 12/02/2022   Tobacco abuse 12/10/2010    Tobacco Use: Social History   Tobacco Use  Smoking Status Former   Current packs/day: 0.00   Average packs/day: 0.5 packs/day for 30.0 years (15.0 ttl pk-yrs)   Types: Cigarettes   Quit date: 12/02/2022   Years since quitting: 0.0  Smokeless Tobacco Never  Tobacco Comments   Quit 12/02/22    Labs: Review Flowsheet       Latest Ref Rng & Units 12/11/2010 12/02/2022  Labs for ITP Cardiac and Pulmonary Rehab  Cholestrol 0 - 200 mg/dL 161  096   LDL (calc) 0 - 99 mg/dL 045  80   HDL-C >40 mg/dL 37  54   Trlycerides <981 mg/dL 191  478   Hemoglobin G9F 4.8 - 5.6 % - 5.5   TCO2 22 - 32 mmol/L - 20     Details            Capillary Blood Glucose: No results found for:  "GLUCAP"   Exercise Target Goals: Exercise Program Goal: Individual exercise prescription set using results from initial 6 min walk test and THRR while considering  patient's activity barriers and safety.   Exercise Prescription Goal: Starting with aerobic activity 30 plus minutes a day, 3 days per week for initial exercise prescription. Provide home exercise prescription and guidelines that participant acknowledges understanding prior to discharge.  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 HR 80 bpm     Max Ex. BP 120/70     2 Minute Post BP 108/70              Oxygen Initial Assessment:   Oxygen Re-Evaluation:   Oxygen Discharge (Final Oxygen Re-Evaluation):   Initial Exercise Prescription:  Initial Exercise Prescription - 12/17/22 1500       Date of Initial Exercise RX and Referring Provider   Date 12/17/22    Referring Provider Tonny Bollman MD      Oxygen   Maintain Oxygen Saturation 88% or higher      Prescription Details   Frequency (times per week) 3    Duration Progress to 30 minutes of continuous aerobic without signs/symptoms of physical distress      Intensity   THRR 40-80% of Max Heartrate  109-142    Ratings of Perceived Exertion 11-13    Perceived Dyspnea 0-4      Resistance Training   Training Prescription Yes    Weight 4 lbs /  bluw band    Reps 10-15             Perform Capillary Blood Glucose checks as needed.  Exercise Prescription Changes:   Exercise Prescription Changes     Row Name 12/17/22 1500             Response to Exercise   Blood Pressure (Admit) 100/70       Blood Pressure (Exercise) 120/70       Blood Pressure (Exit) 108/70       Heart Rate (Admit) 76 bpm       Heart Rate (Exercise) 80 bpm       Heart Rate (Exit) 70 bpm       Oxygen Saturation (Admit) 97 %       Oxygen Saturation (Exercise) 95 %       Oxygen Saturation (Exit) 95 %       Rating of Perceived Exertion (Exercise) 13       Perceived Dyspnea (Exercise) 1         Treadmill   MPH 1.4       Grade 0       Minutes 15       METs 2.4         NuStep   Level 2       SPM 50       Minutes 15       METs 1.8                Exercise Comments:   Exercise Goals and Review:   Exercise Goals     Row Name 12/15/22 1325 12/17/22 1525           Exercise Goals   Increase Physical Activity Yes Yes      Intervention Provide advice, education, support and counseling about physical activity/exercise needs.;Develop an individualized exercise prescription for aerobic and resistive training based on initial evaluation findings, risk stratification, comorbidities and participant's personal goals. --      Expected Outcomes Short Term: Attend rehab on a regular basis to increase amount of physical activity.;Long Term: Add in home exercise to make exercise part of routine and to increase amount of physical activity.;Long Term: Exercising regularly at least 3-5 days a week. --      Increase Strength and Stamina Yes --      Intervention Provide advice, education, support and counseling about physical activity/exercise needs.;Develop an individualized exercise prescription for aerobic  and resistive training based on initial evaluation findings, risk stratification, comorbidities and participant's personal goals. --      Expected Outcomes Short Term: Increase workloads from initial exercise prescription for resistance, speed, and METs.;Short Term: Perform resistance training exercises routinely during rehab and add in resistance training at home;Long Term: Improve cardiorespiratory fitness, muscular endurance and strength as measured by increased METs and functional capacity ( ) --      Able to understand and use rate of perceived exertion (RPE) scale Yes --      Intervention Provide education and explanation on how to use RPE scale --      Expected Outcomes Short Term: Able to use RPE daily in rehab to express subjective intensity level;Long Term:  Able to use RPE to guide intensity level when exercising independently --      Able to understand and use Dyspnea scale Yes --      Intervention Provide education and explanation on how to use Dyspnea scale --      Expected Outcomes  Short Term: Able to use Dyspnea scale daily in rehab to express subjective sense of shortness of breath during exertion;Long Term: Able to use Dyspnea scale to guide intensity level when exercising independently --      Knowledge and understanding of Target Heart Rate Range (THRR) Yes --      Intervention Provide education and explanation of THRR including how the numbers were predicted and where they are located for reference --      Expected Outcomes Short Term: Able to state/look up THRR;Long Term: Able to use THRR to govern intensity when exercising independently;Short Term: Able to use daily as guideline for intensity in rehab --      Able to check pulse independently Yes --      Intervention Provide education and demonstration on how to check pulse in carotid and radial arteries.;Review the importance of being able to check your own pulse for safety during independent exercise --      Expected Outcomes  Short Term: Able to explain why pulse checking is important during independent exercise;Long Term: Able to check pulse independently and accurately --      Understanding of Exercise Prescription Yes --      Intervention Provide education, explanation, and written materials on patient's individual exercise prescription --      Expected Outcomes Short Term: Able to explain program exercise prescription;Long Term: Able to explain home exercise prescription to exercise independently --               Exercise Goals Re-Evaluation :    Discharge Exercise Prescription (Final Exercise Prescription Changes):  Exercise Prescription Changes - 12/17/22 1500       Response to Exercise   Blood Pressure (Admit) 100/70    Blood Pressure (Exercise) 120/70    Blood Pressure (Exit) 108/70    Heart Rate (Admit) 76 bpm    Heart Rate (Exercise) 80 bpm    Heart Rate (Exit) 70 bpm    Oxygen Saturation (Admit) 97 %    Oxygen Saturation (Exercise) 95 %    Oxygen Saturation (Exit) 95 %    Rating of Perceived Exertion (Exercise) 13    Perceived Dyspnea (Exercise) 1      Treadmill   MPH 1.4    Grade 0    Minutes 15    METs 2.4      NuStep   Level 2    SPM 50    Minutes 15    METs 1.8             Nutrition:  Target Goals: Understanding of nutrition guidelines, daily intake of sodium 1500mg , cholesterol 200mg , calories 30% from fat and 7% or less from saturated fats, daily to have 5 or more servings of fruits and vegetables.  Biometrics:  Pre Biometrics - 12/17/22 1525       Pre Biometrics   Height 5\' 4"  (1.626 m)    Weight 63 kg    Waist Circumference 36 inches    Hip Circumference 40 inches    Waist to Hip Ratio 0.9 %    BMI (Calculated) 23.83    Grip Strength 14.2 kg    Single Leg Stand 5.75 seconds              Nutrition Therapy Plan and Nutrition Goals:  Nutrition Therapy & Goals - 12/15/22 1326       Nutrition Therapy   Diet DASH Diet: Has purchased book to  learn more about it.  Intervention Plan   Intervention Nutrition handout(s) given to patient.    Expected Outcomes Short Term Goal: Understand basic principles of dietary content, such as calories, fat, sodium, cholesterol and nutrients.;Long Term Goal: Adherence to prescribed nutrition plan.             Nutrition Assessments:  MEDIFICTS Score Key: >=70 Need to make dietary changes  40-70 Heart Healthy Diet <= 40 Therapeutic Level Cholesterol Diet  Flowsheet Row CARDIAC REHAB PHASE II ORIENTATION from 12/17/2022 in [facility] CARDIAC REHABILITATION  Picture Your Plate Total Score on Admission 28      Picture Your Plate Scores: <82 Unhealthy dietary pattern with much room for improvement. 41-50 Dietary pattern unlikely to meet recommendations for good health and room for improvement. 51-60 More healthful dietary pattern, with some room for improvement.  >60 Healthy dietary pattern, although there may be some specific behaviors that could be improved.    Nutrition Goals Re-Evaluation:   Nutrition Goals Discharge (Final Nutrition Goals Re-Evaluation):   Psychosocial: Target Goals: Acknowledge presence or absence of significant depression and/or stress, maximize coping skills, provide positive support system. Participant is able to verbalize types and ability to use techniques and skills needed for reducing stress and depression.  Initial Review & Psychosocial Screening:  Initial Psych Review & Screening - 12/15/22 1314       Initial Review   Current issues with Current Stress Concerns;History of Depression;Current Depression;Current Anxiety/Panic;Current Psychotropic Meds;Current Sleep Concerns    Source of Stress Concerns Transportation;Financial    Comments Relies on insurance for transportation to appointments, sleeping better      Family Dynamics   Good Support System? No   daughter lives in another town, son lives in [place], has a close friend but not a  confidant   Strains Intra-family strains   kids live away from home, talks to daughter frequently   Concerns Inappropriate over/under dependence on family/friends;No support system      Barriers   Psychosocial barriers to participate in program Psychosocial barriers identified (see note);The patient should benefit from training in stress management and relaxation.      Screening Interventions   Interventions Encouraged to exercise;Provide feedback about the scores to participant;To provide support and resources with identified psychosocial needs    Expected Outcomes Short Term goal: Utilizing psychosocial counselor, staff and physician to assist with identification of specific Stressors or current issues interfering with healing process. Setting desired goal for each stressor or current issue identified.;Long Term Goal: Stressors or current issues are controlled or eliminated.;Short Term goal: Identification and review with participant of any Quality of Life or Depression concerns found by scoring the questionnaire.;Long Term goal: The participant improves quality of Life and PHQ9 Scores as seen by post scores and/or verbalization of changes             Quality of Life Scores:  Quality of Life - 12/17/22 1539       Quality of Life   Select Quality of Life      Quality of Life Scores   Health/Function Pre 19.6 %    Socioeconomic Pre 26.57 %    Psych/Spiritual Pre 30 %    Family Pre 24 %    GLOBAL Pre 23.82 %            Scores of 19 and below usually indicate a poorer quality of life in these areas.  A difference of  2-3 points is a clinically meaningful difference.  A difference of 2-3 points in  the total score of the Quality of Life Index has been associated with significant improvement in overall quality of life, self-image, physical symptoms, and general health in studies assessing change in quality of life.  PHQ-9: Review Flowsheet       12/17/2022  Depression screen PHQ  2/9  Decreased Interest 1  Down, Depressed, Hopeless 1  PHQ - 2 Score 2  Altered sleeping 1  Tired, decreased energy 1  Change in appetite 0  Feeling bad or failure about yourself  0  Trouble concentrating 0  Moving slowly or fidgety/restless 0  Suicidal thoughts 0  PHQ-9 Score 4  Difficult doing work/chores Somewhat difficult    Details           Interpretation of Total Score  Total Score Depression Severity:  1-4 = Minimal depression, 5-9 = Mild depression, 10-14 = Moderate depression, 15-19 = Moderately severe depression, 20-27 = Severe depression   Psychosocial Evaluation and Intervention:  Psychosocial Evaluation - 12/15/22 1326       Psychosocial Evaluation & Interventions   Interventions Stress management education;Relaxation education;Encouraged to exercise with the program and follow exercise prescription    Comments Lajuana is coming into cardiac rehab after a STEMI and stent to D1.  She is very motivated to get started with rehab and looks forward to strengthening her heart and body.  Prior to the last year, she used to walk a lot but the hills made her stop as they were starting to hurt her back and aggravate her fibromyalgia.  She really wants to work to get back to a walking routinue again, but not have it cause pain.  She is on cymbalta for her fibromyalgia. Right after her heart attack, she was very depressed and weeping daily, but now she is starting to feel better about herself and more positive.  She has quit smoking and has been successful thus far.  Her last cigarrette was the day of her heart attack and she has not had one since.  She was commended for her success.  She quit cold [place] as has taken up drawing and crafting in its place.  She also enjoys writing poetry.  Her health is her biggest stressor and her lack of a support system.  She has two kids.  Her daughter lives in a different part of the state and her son is in [place]. She usually talks to her  daughter most days but does not like to share her stress and concerns with her as she doesn't want to add to her daughter's stress.  She does have a close friend that lives near by but again does not want to burden her friend with her stressors/concerns.  She does rely on her insurance to cover transportation for medical appointments.  Scheduling transportation can be stressful for her as it usually turns a simple appointment into a full day ordeal.  She will be using transportation to get to rehab and hopes that it will go well for her.    Expected Outcomes short: Attend rehab to exercise to build strength Long: Exercise for mood boost and attend education to learn about heart health    Continue Psychosocial Services  Follow up required by staff             Psychosocial Re-Evaluation:   Psychosocial Discharge (Final Psychosocial Re-Evaluation):   Vocational Rehabilitation: Provide vocational rehab assistance to qualifying candidates.   Vocational Rehab Evaluation & Intervention:  Vocational Rehab - 12/15/22 1319  Initial Vocational Rehab Evaluation & Intervention   Assessment shows need for Vocational Rehabilitation No   disabled            Education: Education Goals: Education classes will be provided on a weekly basis, covering required topics. Participant will state understanding/return demonstration of topics presented.  Learning Barriers/Preferences:  Learning Barriers/Preferences - 12/15/22 1308       Learning Barriers/Preferences   Learning Barriers Sight   glasses   Learning Preferences Written Material;Video;Pictoral             Education Topics: Hypertension, Hypertension Reduction -Define heart disease and high blood pressure. Discus how high blood pressure affects the body and ways to reduce high blood pressure.   Exercise and Your Heart -Discuss why it is important to exercise, the FITT principles of exercise, normal and abnormal responses to  exercise, and how to exercise safely.   Angina -Discuss definition of angina, causes of angina, treatment of angina, and how to decrease risk of having angina.   Cardiac Medications -Review what the following cardiac medications are used for, how they affect the body, and side effects that may occur when taking the medications.  Medications include Aspirin, Beta blockers, calcium channel blockers, ACE Inhibitors, angiotensin receptor blockers, diuretics, digoxin, and antihyperlipidemics.   Congestive Heart Failure -Discuss the definition of CHF, how to live with CHF, the signs and symptoms of CHF, and how keep track of weight and sodium intake.   Heart Disease and Intimacy -Discus the effect sexual activity has on the heart, how changes occur during intimacy as we age, and safety during sexual activity.   Smoking Cessation / COPD -Discuss different methods to quit smoking, the health benefits of quitting smoking, and the definition of COPD.   Nutrition I: Fats -Discuss the types of cholesterol, what cholesterol does to the heart, and how cholesterol levels can be controlled.   Nutrition II: Labels -Discuss the different components of food labels and how to read food label   Heart Parts/Heart Disease and PAD -Discuss the anatomy of the heart, the pathway of blood circulation through the heart, and these are affected by heart disease.   Stress I: Signs and Symptoms -Discuss the causes of stress, how stress may lead to anxiety and depression, and ways to limit stress.   Stress II: Relaxation -Discuss different types of relaxation techniques to limit stress.   Warning Signs of Stroke / TIA -Discuss definition of a stroke, what the signs and symptoms are of a stroke, and how to identify when someone is having stroke.   Knowledge Questionnaire Score:   Core Components/Risk Factors/Patient Goals at Admission:  Personal Goals and Risk Factors at Admission - 12/15/22 1317        Core Components/Risk Factors/Patient Goals on Admission    Weight Management Yes;Weight Maintenance    Intervention Weight Management: Develop a combined nutrition and exercise program designed to reach desired caloric intake, while maintaining appropriate intake of nutrient and fiber, sodium and fats, and appropriate energy expenditure required for the weight goal.;Weight Management: Provide education and appropriate resources to help participant work on and attain dietary goals.    Expected Outcomes Short Term: Continue to assess and modify interventions until short term weight is achieved;Long Term: Adherence to nutrition and physical activity/exercise program aimed toward attainment of established weight goal;Weight Maintenance: Understanding of the daily nutrition guidelines, which includes 25-35% calories from fat, 7% or less cal from saturated fats, less than 200mg  cholesterol, less than 1.5gm of sodium, &  5 or more servings of fruits and vegetables daily    Tobacco Cessation Yes   quit 12/02/22   Number of packs per day 0    Intervention Offer self-teaching materials, assist with locating and accessing local/national Quit Smoking programs, and support quit date choice.    Expected Outcomes Long Term: Complete abstinence from all tobacco products for at least 12 months from quit date.;Short Term: Will quit all tobacco product use, adhering to prevention of relapse plan.    Hypertension Yes    Intervention Monitor prescription use compliance.;Provide education on lifestyle modifcations including regular physical activity/exercise, weight management, moderate sodium restriction and increased consumption of fresh fruit, vegetables, and low fat dairy, alcohol moderation, and smoking cessation.    Expected Outcomes Short Term: Continued assessment and intervention until BP is < 140/25mm HG in hypertensive participants. < 130/56mm HG in hypertensive participants with diabetes, heart failure or  chronic kidney disease.;Long Term: Maintenance of blood pressure at goal levels.    Lipids Yes    Intervention Provide education and support for participant on nutrition & aerobic/resistive exercise along with prescribed medications to achieve LDL 70mg , HDL >40mg .    Expected Outcomes Short Term: Participant states understanding of desired cholesterol values and is compliant with medications prescribed. Participant is following exercise prescription and nutrition guidelines.;Long Term: Cholesterol controlled with medications as prescribed, with individualized exercise RX and with personalized nutrition plan. Value goals: LDL < 70mg , HDL > 40 mg.             Core Components/Risk Factors/Patient Goals Review:    Core Components/Risk Factors/Patient Goals at Discharge (Final Review):    ITP Comments:  ITP Comments     Row Name 12/15/22 1325 12/17/22 1532         ITP Comments Completed virtual orientation today.  EP evaluation is scheduled for Wednesday 12/17/22 at 1400 .  Documentation for diagnosis can be found in [facility] encounter 12/02/22. Patient arrived for 1st visit/orientation/education at 1400. Patient was referred to CR by Dr. Tonny Bollman due to STEMI/Stent placement. During orientation advised patient on arrival and appointment times what to wear, what to do before, during and after exercise. Reviewed attendance and class policy.  Pt is scheduled to return Cardiac Rehab on 12/22/22 at 1430. Pt was advised to come to class 15 minutes before class starts.  Discussed RPE/Dpysnea scales. Patient participated in warm up stretches. Patient was able to complete 6 minute walk test.  Telemetry:NSR. Patient was measured for the equipment. Discussed equipment safety with patient. Took patient pre-anthropometric measurements. Patient finished visit at 1500.               Comments: Patient arrived for 1st visit/orientation/education at 1400. Patient was referred to CR by Dr. Tonny Bollman  due to STEMI/Stent placement. During orientation advised patient on arrival and appointment times what to wear, what to do before, during and after exercise. Reviewed attendance and class policy.  Pt is scheduled to return Cardiac Rehab on 12/22/22 at 1430. Pt was advised to come to class 15 minutes before class starts.  Discussed RPE/Dpysnea scales. Patient participated in warm up stretches. Patient was able to complete 6 minute walk test.  Telemetry:NSR. Patient was measured for the equipment. Discussed equipment safety with patient. Took patient pre-anthropometric measurements. Patient finished visit at 1500.

## 2022-12-17 NOTE — Patient Instructions (Signed)
Patient Instructions  Patient Details  Name: Sarah Vargas MRN: [id number] Date of Birth: Sep 14, 1960 Referring Provider:  Tonny Bollman, MD  Below are your personal goals for exercise, nutrition, and risk factors. Our goal is to help you stay on track towards obtaining and maintaining these goals. We will be discussing your progress on these goals with you throughout the program.  Initial Exercise Prescription:  Initial Exercise Prescription - 12/17/22 1500       Date of Initial Exercise RX and Referring Provider   Date 12/17/22    Referring Provider Tonny Bollman MD      Oxygen   Maintain Oxygen Saturation 88% or higher      Prescription Details   Frequency (times per week) 3    Duration Progress to 30 minutes of continuous aerobic without signs/symptoms of physical distress      Intensity   THRR 40-80% of Max Heartrate 109-142    Ratings of Perceived Exertion 11-13    Perceived Dyspnea 0-4      Resistance Training   Training Prescription Yes    Weight 4 lbs / bluw band    Reps 10-15             Exercise Goals: Frequency: Be able to perform aerobic exercise two to three times per week in program working toward 2-5 days per week of home exercise.  Intensity: Work with a perceived exertion of 11 (fairly light) - 15 (hard) while following your exercise prescription.  We will make changes to your prescription with you as you progress through the program.   Duration: Be able to do 30 to 45 minutes of continuous aerobic exercise in addition to a 5 minute warm-up and a 5 minute cool-down routine.   Nutrition Goals: Your personal nutrition goals will be established when you do your nutrition analysis with the dietician.  The following are general nutrition guidelines to follow: Cholesterol < 200mg /day Sodium < 1500mg /day Fiber: Women over 50 yrs - 21 grams per day  Personal Goals:  Personal Goals and Risk Factors at Admission - 12/15/22 1317       Core  Components/Risk Factors/Patient Goals on Admission    Weight Management Yes;Weight Maintenance    Intervention Weight Management: Develop a combined nutrition and exercise program designed to reach desired caloric intake, while maintaining appropriate intake of nutrient and fiber, sodium and fats, and appropriate energy expenditure required for the weight goal.;Weight Management: Provide education and appropriate resources to help participant work on and attain dietary goals.    Expected Outcomes Short Term: Continue to assess and modify interventions until short term weight is achieved;Long Term: Adherence to nutrition and physical activity/exercise program aimed toward attainment of established weight goal;Weight Maintenance: Understanding of the daily nutrition guidelines, which includes 25-35% calories from fat, 7% or less cal from saturated fats, less than 200mg  cholesterol, less than 1.5gm of sodium, & 5 or more servings of fruits and vegetables daily    Tobacco Cessation Yes   quit 12/02/22   Number of packs per day 0    Intervention Offer self-teaching materials, assist with locating and accessing local/national Quit Smoking programs, and support quit date choice.    Expected Outcomes Long Term: Complete abstinence from all tobacco products for at least 12 months from quit date.;Short Term: Will quit all tobacco product use, adhering to prevention of relapse plan.    Hypertension Yes    Intervention Monitor prescription use compliance.;Provide education on lifestyle modifcations including regular physical activity/exercise, weight  management, moderate sodium restriction and increased consumption of fresh fruit, vegetables, and low fat dairy, alcohol moderation, and smoking cessation.    Expected Outcomes Short Term: Continued assessment and intervention until BP is < 140/71mm HG in hypertensive participants. < 130/62mm HG in hypertensive participants with diabetes, heart failure or chronic kidney  disease.;Long Term: Maintenance of blood pressure at goal levels.    Lipids Yes    Intervention Provide education and support for participant on nutrition & aerobic/resistive exercise along with prescribed medications to achieve LDL 70mg , HDL >40mg .    Expected Outcomes Short Term: Participant states understanding of desired cholesterol values and is compliant with medications prescribed. Participant is following exercise prescription and nutrition guidelines.;Long Term: Cholesterol controlled with medications as prescribed, with individualized exercise RX and with personalized nutrition plan. Value goals: LDL < 70mg , HDL > 40 mg.             Tobacco Use Initial Evaluation: Social History   Tobacco Use  Smoking Status Former   Current packs/day: 0.00   Average packs/day: 0.5 packs/day for 30.0 years (15.0 ttl pk-yrs)   Types: Cigarettes   Quit date: 12/02/2022   Years since quitting: 0.0  Smokeless Tobacco Never  Tobacco Comments   Quit 12/02/22    Exercise Goals and Review:  Exercise Goals     Row Name 12/15/22 1325 12/17/22 1525           Exercise Goals   Increase Physical Activity Yes Yes      Intervention Provide advice, education, support and counseling about physical activity/exercise needs.;Develop an individualized exercise prescription for aerobic and resistive training based on initial evaluation findings, risk stratification, comorbidities and participant's personal goals. --      Expected Outcomes Short Term: Attend rehab on a regular basis to increase amount of physical activity.;Long Term: Add in home exercise to make exercise part of routine and to increase amount of physical activity.;Long Term: Exercising regularly at least 3-5 days a week. --      Increase Strength and Stamina Yes --      Intervention Provide advice, education, support and counseling about physical activity/exercise needs.;Develop an individualized exercise prescription for aerobic and  resistive training based on initial evaluation findings, risk stratification, comorbidities and participant's personal goals. --      Expected Outcomes Short Term: Increase workloads from initial exercise prescription for resistance, speed, and METs.;Short Term: Perform resistance training exercises routinely during rehab and add in resistance training at home;Long Term: Improve cardiorespiratory fitness, muscular endurance and strength as measured by increased METs and functional capacity ( ) --      Able to understand and use rate of perceived exertion (RPE) scale Yes --      Intervention Provide education and explanation on how to use RPE scale --      Expected Outcomes Short Term: Able to use RPE daily in rehab to express subjective intensity level;Long Term:  Able to use RPE to guide intensity level when exercising independently --      Able to understand and use Dyspnea scale Yes --      Intervention Provide education and explanation on how to use Dyspnea scale --      Expected Outcomes Short Term: Able to use Dyspnea scale daily in rehab to express subjective sense of shortness of breath during exertion;Long Term: Able to use Dyspnea scale to guide intensity level when exercising independently --      Knowledge and understanding of Target Heart Rate Range (THRR)  Yes --      Intervention Provide education and explanation of THRR including how the numbers were predicted and where they are located for reference --      Expected Outcomes Short Term: Able to state/look up THRR;Long Term: Able to use THRR to govern intensity when exercising independently;Short Term: Able to use daily as guideline for intensity in rehab --      Able to check pulse independently Yes --      Intervention Provide education and demonstration on how to check pulse in carotid and radial arteries.;Review the importance of being able to check your own pulse for safety during independent exercise --      Expected Outcomes  Short Term: Able to explain why pulse checking is important during independent exercise;Long Term: Able to check pulse independently and accurately --      Understanding of Exercise Prescription Yes --      Intervention Provide education, explanation, and written materials on patient's individual exercise prescription --      Expected Outcomes Short Term: Able to explain program exercise prescription;Long Term: Able to explain home exercise prescription to exercise independently --               Copy of goals given to participant.

## 2022-12-22 ENCOUNTER — Encounter (HOSPITAL_COMMUNITY)
Admission: RE | Admit: 2022-12-22 | Discharge: 2022-12-22 | Disposition: A | Discharge status: Home / Self Care | Payer: Medicaid Other | Source: Ambulatory Visit | Attending: Cardiovascular Disease

## 2022-12-22 DIAGNOSIS — Z955 Presence of coronary angioplasty implant and graft: Secondary | ICD-10-CM

## 2022-12-22 DIAGNOSIS — I2129 ST elevation (STEMI) myocardial infarction involving other sites: Secondary | ICD-10-CM | POA: Diagnosis not present

## 2022-12-22 NOTE — Progress Notes (Addendum)
Daily Session Note  Patient Details  Name: Sarah Vargas MRN: [id number] Date of Birth: [birth year] Referring Provider:   Flowsheet Row CARDIAC REHAB PHASE II ORIENTATION from 12/17/2022 in [facility]  Referring Provider Tonny Bollman MD       Encounter Date: 12/22/2022  Check In:  Session Check In - 12/22/22 1430       Check-In   Supervising physician immediately available to respond to emergencies See telemetry face sheet for immediately available MD    Location [facility]    Staff Present Fabio Pierce, MA, RCEP, CCRP, CCET;Annaleigh Steinmeyer Fredric Mare, [place], Exercise Physiologist    Virtual Visit No    Medication changes reported     No    Fall or balance concerns reported    No    Tobacco Cessation No Change    Warm-up and Cool-down Performed on first and last piece of equipment    Resistance Training Performed Yes    VAD Patient? No    PAD/SET Patient? No      Pain Assessment   Currently in Pain? No/denies    Pain Score 0-No pain    Multiple Pain Sites No             Capillary Blood Glucose: No results found for this or any previous visit (from the past 24 hour(s)).    Social History   Tobacco Use  Smoking Status Former   Current packs/day: 0.00   Average packs/day: 0.5 packs/day for 30.0 years (15.0 ttl pk-yrs)   Types: Cigarettes   Quit date: 12/02/2022   Years since quitting: 0.0  Smokeless Tobacco Never  Tobacco Comments   Quit 12/02/22    Goals Met:  Independence with exercise equipment Exercise tolerated well No report of concerns or symptoms today Strength training completed today  Goals Unmet:  Not Applicable  Comments: First full day of exercise!  Patient was oriented to gym and equipment including functions, settings, policies, and procedures.  Patient's individual exercise prescription and treatment plan were reviewed.  All starting workloads were established based on the results of the 6 minute walk test  done at initial orientation visit.  The plan for exercise progression was also introduced and progression will be customized based on patient's performance and goals.

## 2022-12-24 ENCOUNTER — Encounter (HOSPITAL_COMMUNITY)
Admission: RE | Admit: 2022-12-24 | Discharge: 2022-12-24 | Disposition: A | Discharge status: Home / Self Care | Payer: Medicaid Other | Source: Ambulatory Visit | Attending: Cardiovascular Disease | Admitting: Cardiovascular Disease

## 2022-12-24 DIAGNOSIS — Z955 Presence of coronary angioplasty implant and graft: Secondary | ICD-10-CM

## 2022-12-24 DIAGNOSIS — I2129 ST elevation (STEMI) myocardial infarction involving other sites: Secondary | ICD-10-CM | POA: Diagnosis not present

## 2022-12-24 NOTE — Progress Notes (Signed)
Daily Session Note  Patient Details  Name: CHARLENE DETTER MRN: [id number] Date of Birth: [birth year] Referring Provider:   Flowsheet Row CARDIAC REHAB PHASE II ORIENTATION from 12/17/2022 in [facility]  Referring Provider Tonny Bollman MD       Encounter Date: 12/24/2022  Check In:  Session Check In - 12/24/22 1430       Check-In   Supervising physician immediately available to respond to emergencies See telemetry face sheet for immediately available MD    Location [facility]    Staff Present Ross Ludwig, BS, Exercise Physiologist;Jessica Juanetta Gosling, MA, RCEP, CCRP, CCET;Hillary Troutman BSN, RN    Virtual Visit No    Medication changes reported     No    Fall or balance concerns reported    No    Tobacco Cessation No Change    Warm-up and Cool-down Performed on first and last piece of equipment    Resistance Training Performed Yes    VAD Patient? No    PAD/SET Patient? No      Pain Assessment   Currently in Pain? No/denies    Pain Score 0-No pain    Multiple Pain Sites No             Capillary Blood Glucose: No results found for this or any previous visit (from the past 24 hour(s)).    Social History   Tobacco Use  Smoking Status Former   Current packs/day: 0.00   Average packs/day: 0.5 packs/day for 30.0 years (15.0 ttl pk-yrs)   Types: Cigarettes   Quit date: 12/02/2022   Years since quitting: 0.0  Smokeless Tobacco Never  Tobacco Comments   Quit 12/02/22    Goals Met:  Independence with exercise equipment Exercise tolerated well No report of concerns or symptoms today Strength training completed today  Goals Unmet:  Not Applicable  Comments: Pt able to follow exercise prescription today without complaint.  Will continue to monitor for progression.

## 2022-12-26 ENCOUNTER — Encounter (HOSPITAL_COMMUNITY)
Admission: RE | Admit: 2022-12-26 | Discharge: 2022-12-26 | Disposition: A | Discharge status: Home / Self Care | Payer: Medicaid Other | Source: Ambulatory Visit | Attending: Cardiovascular Disease | Admitting: Cardiovascular Disease

## 2022-12-26 DIAGNOSIS — Z955 Presence of coronary angioplasty implant and graft: Secondary | ICD-10-CM

## 2022-12-26 DIAGNOSIS — I2129 ST elevation (STEMI) myocardial infarction involving other sites: Secondary | ICD-10-CM

## 2022-12-26 NOTE — Progress Notes (Signed)
Daily Session Note  Patient Details  Name: Sarah Vargas MRN: [id number] Date of Birth: 04/11/1960 Referring Provider:   Flowsheet Row CARDIAC REHAB PHASE II ORIENTATION from 12/17/2022 in [facility]  Referring Provider Tonny Bollman MD       Encounter Date: 12/26/2022  Check In:  Session Check In - 12/26/22 1430       Check-In   Location [facility]    Staff Present Enid Derry, RN, BSN;Ileanna Gemmill, RN;Jessica [place], MA, RCEP, CCRP, CCET    Virtual Visit No    Medication changes reported     No    Fall or balance concerns reported    No    Tobacco Cessation No Change    Warm-up and Cool-down Performed on first and last piece of equipment    Resistance Training Performed Yes    VAD Patient? No    PAD/SET Patient? No      Pain Assessment   Currently in Pain? No/denies    Pain Score 0-No pain    Multiple Pain Sites No             Capillary Blood Glucose: No results found for this or any previous visit (from the past 24 hour(s)).    Social History   Tobacco Use  Smoking Status Former   Current packs/day: 0.00   Average packs/day: 0.5 packs/day for 30.0 years (15.0 ttl pk-yrs)   Types: Cigarettes   Quit date: 12/02/2022   Years since quitting: 0.0  Smokeless Tobacco Never  Tobacco Comments   Quit 12/02/22    Goals Met:  Independence with exercise equipment Exercise tolerated well No report of concerns or symptoms today Strength training completed today  Goals Unmet:  Not Applicable  Comments: Pt able to follow exercise prescription today without complaint.  Will continue to monitor for progression.

## 2022-12-29 ENCOUNTER — Encounter (HOSPITAL_COMMUNITY): Payer: Medicaid Other

## 2022-12-30 ENCOUNTER — Other Ambulatory Visit (HOSPITAL_COMMUNITY): Payer: Self-pay

## 2022-12-31 ENCOUNTER — Encounter (HOSPITAL_COMMUNITY)
Admission: RE | Admit: 2022-12-31 | Discharge: 2022-12-31 | Disposition: A | Discharge status: Home / Self Care | Payer: Medicaid Other | Source: Ambulatory Visit | Attending: Cardiovascular Disease

## 2022-12-31 DIAGNOSIS — Z955 Presence of coronary angioplasty implant and graft: Secondary | ICD-10-CM

## 2022-12-31 DIAGNOSIS — I2129 ST elevation (STEMI) myocardial infarction involving other sites: Secondary | ICD-10-CM

## 2022-12-31 NOTE — Progress Notes (Signed)
Daily Session Note  Patient Details  Name: Sarah Vargas MRN: [id number] Date of Birth: October 03, 1960 Referring Provider:   Flowsheet Row CARDIAC REHAB PHASE II ORIENTATION from 12/17/2022 in [facility] C[facility]AC [facility]  Referring Provider Tonny Bollman MD       Encounter Date: 12/31/2022  Check In:  Session Check In - 12/31/22 1400       Check-In   Supervising physician immediately available to respond to emergencies See telemetry face sheet for immediately available MD    Location [facility]    Staff Present Ross Ludwig, BS, Exercise Physiologist;Zaniel Marineau Leonidas Romberg BSN, RN    Virtual Visit No    Medication changes reported     No    Fall or balance concerns reported    No    Tobacco Cessation No Change    Warm-up and Cool-down Performed on first and last piece of equipment    Resistance Training Performed Yes    VAD Patient? No    PAD/SET Patient? No      Pain Assessment   Currently in Pain? No/denies    Pain Score 0-No pain    Multiple Pain Sites No             Capillary Blood Glucose: No results found for this or any previous visit (from the past 24 hour(s)).    Social History   Tobacco Use  Smoking Status Former   Current packs/day: 0.00   Average packs/day: 0.5 packs/day for 30.0 years (15.0 ttl pk-yrs)   Types: Cigarettes   Quit date: 12/02/2022   Years since quitting: 0.0  Smokeless Tobacco Never  Tobacco Comments   Quit 12/02/22    Goals Met:  Independence with exercise equipment Exercise tolerated well No report of concerns or symptoms today Strength training completed today  Goals Unmet:  Not Applicable  Comments: Marland KitchenMarland KitchenPt able to follow exercise prescription today without complaint.  Will continue to monitor for progression.

## 2023-01-02 ENCOUNTER — Encounter (HOSPITAL_COMMUNITY): Payer: Medicaid Other

## 2023-01-05 ENCOUNTER — Encounter (HOSPITAL_COMMUNITY)
Admission: RE | Admit: 2023-01-05 | Discharge: 2023-01-05 | Disposition: A | Discharge status: Home / Self Care | Payer: Medicaid Other | Source: Ambulatory Visit | Attending: Cardiovascular Disease | Admitting: Cardiovascular Disease

## 2023-01-05 DIAGNOSIS — I2129 ST elevation (STEMI) myocardial infarction involving other sites: Secondary | ICD-10-CM

## 2023-01-05 DIAGNOSIS — Z955 Presence of coronary angioplasty implant and graft: Secondary | ICD-10-CM

## 2023-01-05 NOTE — Progress Notes (Signed)
Reviewed home exercise with pt today.  Pt plans to walk and use staff videos at home for exercise.  Reviewed THR, pulse, RPE, sign and symptoms, pulse oximetery and when to call 911 or MD.  Also discussed weather considerations and indoor options.  Pt voiced understanding.

## 2023-01-05 NOTE — Progress Notes (Signed)
Daily Session Note  Patient Details  Name: Sarah Vargas MRN: [id number] Date of Birth: [birth year] Referring Provider:   Flowsheet Row CARDIAC REHAB PHASE II ORIENTATION from 12/17/2022 in [facility]  Referring Provider Tonny Bollman MD       Encounter Date: 01/05/2023  Check In:  Session Check In - 01/05/23 1415       Check-In   Supervising physician immediately available to respond to emergencies See telemetry face sheet for immediately available ER MD    Location [facility]    Staff Present Fabio Pierce, MA, RCEP, CCRP, Dow Adolph, RN, BSN    Virtual Visit No    Medication changes reported     No    Fall or balance concerns reported    No    Warm-up and Cool-down Performed on first and last piece of equipment    Resistance Training Performed Yes    VAD Patient? No    PAD/SET Patient? No      Pain Assessment   Currently in Pain? No/denies    Pain Score 0-No pain    Multiple Pain Sites No             Capillary Blood Glucose: No results found for this or any previous visit (from the past 24 hour(s)).    Social History   Tobacco Use  Smoking Status Former   Current packs/day: 0.00   Average packs/day: 0.5 packs/day for 30.0 years (15.0 ttl pk-yrs)   Types: Cigarettes   Quit date: 12/02/2022   Years since quitting: 0.0  Smokeless Tobacco Never  Tobacco Comments   Quit 12/02/22    Goals Met:  Independence with exercise equipment Exercise tolerated well No report of concerns or symptoms today Strength training completed today  Goals Unmet:  Not Applicable  Comments: Pt able to follow exercise prescription today without complaint.  Will continue to monitor for progression.

## 2023-01-07 ENCOUNTER — Encounter (HOSPITAL_COMMUNITY)
Admission: RE | Admit: 2023-01-07 | Discharge: 2023-01-07 | Disposition: A | Discharge status: Home / Self Care | Payer: Medicaid Other | Source: Ambulatory Visit | Attending: Cardiovascular Disease

## 2023-01-07 DIAGNOSIS — Z955 Presence of coronary angioplasty implant and graft: Secondary | ICD-10-CM

## 2023-01-07 DIAGNOSIS — I2129 ST elevation (STEMI) myocardial infarction involving other sites: Secondary | ICD-10-CM

## 2023-01-07 NOTE — Progress Notes (Signed)
Daily Session Note  Patient Details  Name: Sarah Vargas MRN: [id number] Date of Birth: [birth year] Referring Provider:   Flowsheet Row CARDIAC REHAB PHASE II ORIENTATION from 12/17/2022 in [facility]  Referring Provider Tonny Bollman MD       Encounter Date: 01/07/2023  Check In:  Session Check In - 01/07/23 1400       Check-In   Supervising physician immediately available to respond to emergencies See telemetry face sheet for immediately available MD    Location [facility]    Staff Present Ross Ludwig, BS, Exercise Physiologist;Angelin Cutrone [place] BSN, RN;Jessica [place], MA, RCEP, CCRP, CCET    Virtual Visit No    Medication changes reported     No    Fall or balance concerns reported    No    Tobacco Cessation No Change    Warm-up and Cool-down Performed on first and last piece of equipment    Resistance Training Performed Yes    VAD Patient? No    PAD/SET Patient? No      Pain Assessment   Currently in Pain? No/denies    Pain Score 0-No pain    Multiple Pain Sites No             Capillary Blood Glucose: No results found for this or any previous visit (from the past 24 hour(s)).    Social History   Tobacco Use  Smoking Status Former   Current packs/day: 0.00   Average packs/day: 0.5 packs/day for 30.0 years (15.0 ttl pk-yrs)   Types: Cigarettes   Quit date: 12/02/2022   Years since quitting: 0.0  Smokeless Tobacco Never  Tobacco Comments   Quit 12/02/22    Goals Met:  Independence with exercise equipment Exercise tolerated well No report of concerns or symptoms today Strength training completed today  Goals Unmet:  Not Applicable  Comments: Marland KitchenMarland KitchenPt able to follow exercise prescription today without complaint.  Will continue to monitor for progression.

## 2023-01-09 ENCOUNTER — Encounter (HOSPITAL_COMMUNITY)
Admission: RE | Admit: 2023-01-09 | Discharge: 2023-01-09 | Disposition: A | Discharge status: Home / Self Care | Payer: Medicaid Other | Source: Ambulatory Visit | Attending: Cardiovascular Disease | Admitting: Cardiovascular Disease

## 2023-01-09 DIAGNOSIS — Z955 Presence of coronary angioplasty implant and graft: Secondary | ICD-10-CM | POA: Insufficient documentation

## 2023-01-09 DIAGNOSIS — I2129 ST elevation (STEMI) myocardial infarction involving other sites: Secondary | ICD-10-CM | POA: Diagnosis present

## 2023-01-09 NOTE — Progress Notes (Signed)
Daily Session Note  Patient Details  Name: AVYNN KLASSEN MRN: [id number] Date of Birth: 27-Jun-1960 Referring Provider:   Flowsheet Row CARDIAC REHAB PHASE II ORIENTATION from 12/17/2022 in [facility] C[facility]AC [facility]  Referring Provider Tonny Bollman MD       Encounter Date: 01/09/2023  Check In:  Session Check In - 01/09/23 1415       Check-In   Supervising physician immediately available to respond to emergencies See telemetry face sheet for immediately available ER MD    Location [facility]    Staff Present Rodena Medin, RN, BSN;Heather Fredric Mare, BS, Exercise Physiologist    Virtual Visit No    Medication changes reported     No    Fall or balance concerns reported    No    Warm-up and Cool-down Performed on first and last piece of equipment    Resistance Training Performed Yes    VAD Patient? No    PAD/SET Patient? No      Pain Assessment   Currently in Pain? No/denies    Pain Score 0-No pain    Multiple Pain Sites No             Capillary Blood Glucose: No results found for this or any previous visit (from the past 24 hour(s)).    Social History   Tobacco Use  Smoking Status Former   Current packs/day: 0.00   Average packs/day: 0.5 packs/day for 30.0 years (15.0 ttl pk-yrs)   Types: Cigarettes   Quit date: 12/02/2022   Years since quitting: 0.1  Smokeless Tobacco Never  Tobacco Comments   Quit 12/02/22    Goals Met:  Independence with exercise equipment Exercise tolerated well No report of concerns or symptoms today Strength training completed today  Goals Unmet:  Not Applicable  Comments: Pt able to follow exercise prescription today without complaint.  Will continue to monitor for progression.

## 2023-01-12 ENCOUNTER — Encounter (HOSPITAL_COMMUNITY): Payer: Medicaid Other

## 2023-01-14 ENCOUNTER — Encounter (HOSPITAL_COMMUNITY)
Admission: RE | Admit: 2023-01-14 | Discharge: 2023-01-14 | Disposition: A | Discharge status: Home / Self Care | Payer: Medicaid Other | Source: Ambulatory Visit | Attending: Cardiovascular Disease | Admitting: Cardiovascular Disease

## 2023-01-14 ENCOUNTER — Encounter (HOSPITAL_COMMUNITY): Payer: Self-pay | Admitting: *Deleted

## 2023-01-14 DIAGNOSIS — I2129 ST elevation (STEMI) myocardial infarction involving other sites: Secondary | ICD-10-CM | POA: Diagnosis not present

## 2023-01-14 DIAGNOSIS — Z955 Presence of coronary angioplasty implant and graft: Secondary | ICD-10-CM

## 2023-01-14 NOTE — Progress Notes (Signed)
Daily Session Note  Patient Details  Name: Sarah Vargas MRN: [id number] Date of Birth: 1960-10-12 Referring Provider:   Flowsheet Row CARDIAC REHAB PHASE II ORIENTATION from 12/17/2022 in [facility] CARDIAC REHABILITATION  Referring Provider Tonny Bollman MD       Encounter Date: 01/14/2023  Check In:  Session Check In - 01/14/23 1407       Check-In   Supervising physician immediately available to respond to emergencies See telemetry face sheet for immediately available MD    Location [facility]    Staff Present Ross Ludwig, BS, Exercise Physiologist;Jessica Juanetta Gosling, MA, RCEP, CCRP, CCET    Virtual Visit No    Medication changes reported     No    Fall or balance concerns reported    No    Tobacco Cessation No Change    Warm-up and Cool-down Performed on first and last piece of equipment    Resistance Training Performed Yes    VAD Patient? No    PAD/SET Patient? No      Pain Assessment   Currently in Pain? No/denies    Pain Score 0-No pain    Multiple Pain Sites No             Capillary Blood Glucose: No results found for this or any previous visit (from the past 24 hour(s)).    Social History   Tobacco Use  Smoking Status Former   Current packs/day: 0.00   Average packs/day: 0.5 packs/day for 30.0 years (15.0 ttl pk-yrs)   Types: Cigarettes   Quit date: 12/02/2022   Years since quitting: 0.1  Smokeless Tobacco Never  Tobacco Comments   Quit 12/02/22    Goals Met:  Independence with exercise equipment Exercise tolerated well No report of concerns or symptoms today Strength training completed today  Goals Unmet:  Not Applicable  Comments: Pt able to follow exercise prescription today without complaint.  Will continue to monitor for progression.

## 2023-01-14 NOTE — Progress Notes (Signed)
Cardiac Individual Treatment Plan  Patient Details  Name: RHIA BLATCHFORD MRN: [id number] Date of Birth: Dec 13, 1960 Referring Provider:   Flowsheet Row CARDIAC REHAB PHASE II ORIENTATION from 12/17/2022 in [facility] CARDIAC REHABILITATION  Referring Provider Tonny Bollman MD       Initial Encounter Date:  Flowsheet Row CARDIAC REHAB PHASE II ORIENTATION from 12/17/2022 in [facility] [place] CARDIAC REHABILITATION  Date 12/17/22       Visit Diagnosis: ST elevation myocardial infarction (STEMI) involving other coronary artery [facility])  Status post coronary artery stent placement  Patient's Home Medications on Admission:  Current Outpatient Medications:    alendronate (FOSAMAX) 70 MG tablet, Take 70 mg by mouth once a week. Take on empty stomach with full glass of water, sit upright for 1 hour after., Disp: , Rfl:    aspirin EC 81 MG tablet, Take 1 tablet (81 mg total) by mouth daily. Swallow whole., Disp: 120 tablet, Rfl: 12   baclofen (LIORESAL) 10 MG tablet, Take 10 mg by mouth 3 (three) times daily., Disp: , Rfl:    Calcium Carb-Cholecalciferol (CALCIUM 600 + D PO), Take 1 tablet by mouth in the morning and at bedtime., Disp: , Rfl:    DULoxetine (CYMBALTA) 30 MG capsule, Take 30 mg by mouth See admin instructions. Take with Duloxetine 60mg  to equal 90mg  total., Disp: , Rfl:    DULoxetine (CYMBALTA) 60 MG capsule, Take 60 mg by mouth See admin instructions. Take with Duloxetine 30mg  to equal 90mg  total., Disp: , Rfl:    loratadine (CLARITIN) 10 MG tablet, Take 10 mg by mouth daily., Disp: , Rfl:    metoprolol succinate (TOPROL-XL) 25 MG 24 hr tablet, Take 0.5 tablets (12.5 mg total) by mouth daily., Disp: 45 tablet, Rfl: 0   Multiple Vitamin (MULTIVITAMIN) tablet, Take 1 tablet by mouth daily., Disp: , Rfl:    nitroGLYCERIN (NITROSTAT) 0.4 MG SL tablet, Place 1 tablet (0.4 mg total) under the tongue every 5 (five) minutes as needed for chest pain., Disp: 25 tablet, Rfl: 3   oxybutynin  (DITROPAN) 5 MG tablet, Take 5 mg by mouth daily. (Patient not taking: Reported on 12/17/2022), Disp: , Rfl:    pantoprazole (PROTONIX) 40 MG tablet, Take 1 tablet (40 mg total) by mouth daily., Disp: 90 tablet, Rfl: 1   rosuvastatin (CRESTOR) 40 MG tablet, Take 1 tablet (40 mg total) by mouth daily., Disp: 90 tablet, Rfl: 1   ticagrelor (BRILINTA) 90 MG TABS tablet, Take 1 tablet (90 mg total) by mouth 2 (two) times daily., Disp: 180 tablet, Rfl: 3   topiramate (TOPAMAX) 50 MG tablet, Take 50 mg by mouth 2 (two) times daily., Disp: , Rfl:    traZODone (DESYREL) 150 MG tablet, Take 150 mg by mouth at bedtime., Disp: , Rfl:    triamcinolone ointment (KENALOG) 0.5 %, Apply 1 Application topically 2 (two) times daily., Disp: , Rfl:   Past Medical History: Past Medical History:  Diagnosis Date   Back pain    Chest pain    12/2010.   Fibromyalgia    Generalized headaches    GERD (gastroesophageal reflux disease) 12/10/2010   HTN (hypertension) 12/10/2010   Kidney stones    s/p ureteral stents-removed in past.   LBP (low back pain) 12/10/2010   Osteoarthritis    Positive H. pylori test 12/10/2010   Treated 2011.   STEMI (ST elevation myocardial infarction) (HCC) 12/02/2022   Tobacco abuse 12/10/2010    Tobacco Use: Social History   Tobacco Use  Smoking Status Former   Current packs/day: 0.00   Average packs/day: 0.5 packs/day for 30.0 years (15.0 ttl pk-yrs)   Types: Cigarettes   Quit date: 12/02/2022   Years since quitting: 0.1  Smokeless Tobacco Never  Tobacco Comments   Quit 12/02/22    Labs: Review Flowsheet       Latest Ref Rng & Units 12/11/2010 12/02/2022  Labs for ITP Cardiac and Pulmonary Rehab  Cholestrol 0 - 200 mg/dL 440  102   LDL (calc) 0 - 99 mg/dL 725  80   HDL-C >36 mg/dL 37  54   Trlycerides <644 mg/dL 034  742   Hemoglobin V9D 4.8 - 5.6 % - 5.5   TCO2 22 - 32 mmol/L - 20     Details            Capillary Blood Glucose: No results found for:  "GLUCAP"   Exercise Target Goals: Exercise Program Goal: Individual exercise prescription set using results from initial 6 min walk test and THRR while considering  patient's activity barriers and safety.   Exercise Prescription Goal: Starting with aerobic activity 30 plus minutes a day, 3 days per week for initial exercise prescription. Provide home exercise prescription and guidelines that participant acknowledges understanding prior to discharge.  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 HR 80 bpm     Max Ex. BP 120/70     2 Minute Post BP 108/70              Oxygen Initial Assessment:   Oxygen Re-Evaluation:   Oxygen Discharge (Final Oxygen Re-Evaluation):   Initial Exercise Prescription:  Initial Exercise Prescription - 12/17/22 1500       Date of Initial Exercise RX and Referring Provider   Date 12/17/22    Referring Provider Tonny Bollman MD      Oxygen   Maintain Oxygen Saturation 88% or higher      Prescription Details   Frequency (times per week) 3    Duration Progress to 30 minutes of continuous aerobic without signs/symptoms of physical distress      Intensity   THRR 40-80% of Max Heartrate  109-142    Ratings of Perceived Exertion 11-13    Perceived Dyspnea 0-4      Resistance Training   Training Prescription Yes    Weight 4 lbs /  bluw band    Reps 10-15             Perform Capillary Blood Glucose checks as needed.  Exercise Prescription Changes:   Exercise Prescription Changes     Row Name 12/17/22 1500 01/05/23 1500           Response to Exercise   Blood Pressure (Admit) 100/70 --      Blood Pressure (Exercise) 120/70 --      Blood Pressure (Exit) 108/70 --      Heart Rate (Admit) 76 bpm --      Heart Rate (Exercise) 80 bpm --      Heart Rate (Exit) 70 bpm --      Oxygen Saturation (Admit) 97 % --      Oxygen Saturation (Exercise) 95 % --      Oxygen Saturation (Exit) 95 % --      Rating of Perceived Exertion (Exercise) 13 --      Perceived Dyspnea (Exercise) 1 --        Treadmill   MPH 1.4 --      Grade 0 --      Minutes 15 --      METs 2.4 --        NuStep   Level 2 --      SPM 50 --      Minutes 15 --      METs 1.8 --        Home Exercise Plan   Plans to continue exercise at -- Home (comment)  walking, staff videos      Frequency -- Add 2 additional days to program exercise sessions.      Initial Home Exercises Provided -- 01/05/23               Exercise Comments:   Exercise Goals and Review:   Exercise Goals     Row Name 12/15/22 1325 12/17/22 1525           Exercise Goals   Increase Physical Activity Yes Yes      Intervention Provide advice, education, support and counseling about physical activity/exercise needs.;Develop an individualized exercise prescription for aerobic and resistive training based on initial evaluation findings, risk stratification, comorbidities and participant's personal goals. --      Expected Outcomes Short Term: Attend rehab on a regular basis to increase amount of physical activity.;Long Term: Add in home exercise to make exercise part of routine and to increase amount of physical  activity.;Long Term: Exercising regularly at least 3-5 days a week. --      Increase Strength and Stamina Yes --      Intervention Provide advice, education, support and counseling about physical activity/exercise needs.;Develop an individualized exercise prescription for aerobic and resistive training based on initial evaluation findings, risk stratification, comorbidities and participant's personal goals. --      Expected Outcomes Short Term: Increase workloads from initial exercise prescription for resistance, speed, and METs.;Short Term: Perform resistance training exercises routinely during rehab and add in resistance training at home;Long Term: Improve cardiorespiratory fitness, muscular endurance and strength as measured by increased METs and functional capacity ( ) --      Able to understand and use rate of perceived exertion (RPE) scale Yes --      Intervention Provide education and explanation on how to use RPE scale --      Expected Outcomes Short Term: Able to use RPE daily in rehab to express subjective intensity level;Long Term:  Able to use RPE to guide intensity level when exercising independently --      Able to understand and use Dyspnea scale Yes --      Intervention Provide education and explanation on how to use Dyspnea scale --      Expected Outcomes Short Term: Able to use Dyspnea scale daily in rehab to express subjective sense of shortness of breath during exertion;Long Term: Able to use Dyspnea scale to guide intensity level when exercising independently --      Knowledge and understanding of Target Heart Rate Range (THRR) Yes --      Intervention Provide education and explanation of THRR including how the numbers were predicted and where they are located for reference --      Expected Outcomes Short Term: Able to state/look up THRR;Long Term: Able to use THRR to govern intensity when exercising independently;Short Term: Able to use daily as guideline for intensity in rehab  --      Able to check pulse independently Yes --      Intervention Provide education and demonstration on how to check pulse in carotid and radial arteries.;Review the importance of being able to check your own pulse for safety during independent exercise --      Expected Outcomes Short Term: Able to explain why pulse checking is important during independent exercise;Long Term: Able to check pulse independently and accurately --      Understanding of Exercise Prescription Yes --      Intervention Provide education, explanation, and written materials on patient's individual exercise prescription --      Expected Outcomes Short Term: Able to explain program exercise prescription;Long Term: Able to explain home exercise prescription to exercise independently --               Exercise Goals Re-Evaluation :  Exercise Goals Re-Evaluation     Row Name 01/05/23 1500             Exercise Goal Re-Evaluation   Exercise Goals Review Increase Physical Activity;Increase Strength and Stamina;Understanding of Exercise Prescription       Comments Santiaga is doing well in rehab.  She is feeling good and already walking at home.  She has noted that it can be tough with hills.  She will stay on the sidewalk and just sticking between the hills. Reviewed home exercise with pt today.  Pt plans to walk and use staff videos at home for exercise.  Reviewed THR, pulse, RPE, sign and symptoms, pulse oximetery and when to call 911 or MD.  Also discussed weather considerations and indoor options.  Pt voiced understanding.       Expected Outcomes Short: Start to add in more walking at home Long: Continue to improve stamina                 Discharge Exercise Prescription (Final Exercise Prescription Changes):  Exercise Prescription Changes - 01/05/23 1500       Home Exercise Plan   Plans to continue exercise at Home (comment)   walking, staff videos   Frequency Add 2 additional days to program exercise  sessions.    Initial Home Exercises Provided 01/05/23             Nutrition:  Target Goals: Understanding of nutrition guidelines, daily intake of sodium 1500mg , cholesterol 200mg , calories 30% from fat and 7% or less from saturated fats, daily to have 5 or more servings of fruits and vegetables.  Biometrics:  Pre Biometrics -  12/17/22 1525       Pre Biometrics   Height 5\' 4"  (1.626 m)    Weight 138 lb 14.2 oz (63 kg)    Waist Circumference 36 inches    Hip Circumference 40 inches    Waist to Hip Ratio 0.9 %    BMI (Calculated) 23.83    Grip Strength 14.2 kg    Single Leg Stand 5.75 seconds              Nutrition Therapy Plan and Nutrition Goals:  Nutrition Therapy & Goals - 12/15/22 1326       Nutrition Therapy   Diet DASH Diet: Has purchased book to learn more about it.      Intervention Plan   Intervention Nutrition handout(s) given to patient.    Expected Outcomes Short Term Goal: Understand basic principles of dietary content, such as calories, fat, sodium, cholesterol and nutrients.;Long Term Goal: Adherence to prescribed nutrition plan.             Nutrition Assessments:  MEDIFICTS Score Key: >=70 Need to make dietary changes  40-70 Heart Healthy Diet <= 40 Therapeutic Level Cholesterol Diet  Flowsheet Row CARDIAC REHAB PHASE II ORIENTATION from 12/17/2022 in [facility] CARDIAC REHABILITATION  Picture Your Plate Total Score on Admission 28      Picture Your Plate Scores: <20 Unhealthy dietary pattern with much room for improvement. 41-50 Dietary pattern unlikely to meet recommendations for good health and room for improvement. 51-60 More healthful dietary pattern, with some room for improvement.  >60 Healthy dietary pattern, although there may be some specific behaviors that could be improved.    Nutrition Goals Re-Evaluation:  Nutrition Goals Re-Evaluation     Row Name 01/05/23 1455             Goals   Nutrition Goal Heart  Healthy Diet       Comment Paolina is doing well in rehab.  She is doing well with her diet.  She has really gotten away from salt.  She makes sure to buy low sodium and not add salt.  She is also doing well with adding in more fruits and vegetables.  Her favorite snack is olives and she has found some low salt olives.  She found a new recipe to try that was low salt with fruits and vegetables.  She is a little worried about the jalopneno.  She has also tried salamon for first time.       Expected Outcome Short: Try out new recipe she was talking about Long: Continue to eat more heart healthy.                Nutrition Goals Discharge (Final Nutrition Goals Re-Evaluation):  Nutrition Goals Re-Evaluation - 01/05/23 1455       Goals   Nutrition Goal Heart Healthy Diet    Comment Georgianna is doing well in rehab.  She is doing well with her diet.  She has really gotten away from salt.  She makes sure to buy low sodium and not add salt.  She is also doing well with adding in more fruits and vegetables.  Her favorite snack is olives and she has found some low salt olives.  She found a new recipe to try that was low salt with fruits and vegetables.  She is a little worried about the jalopneno.  She has also tried salamon for first time.    Expected Outcome Short: Try out new recipe she was talking  about Long: Continue to eat more heart healthy.             Psychosocial: Target Goals: Acknowledge presence or absence of significant depression and/or stress, maximize coping skills, provide positive support system. Participant is able to verbalize types and ability to use techniques and skills needed for reducing stress and depression.  Initial Review & Psychosocial Screening:  Initial Psych Review & Screening - 12/15/22 1314       Initial Review   Current issues with Current Stress Concerns;History of Depression;Current Depression;Current Anxiety/Panic;Current Psychotropic Meds;Current Sleep  Concerns    Source of Stress Concerns Transportation;Financial    Comments Relies on insurance for transportation to appointments, sleeping better      Family Dynamics   Good Support System? No   daughter lives in another town, son lives in [place], has a close friend but not a confidant   Strains Intra-family strains   kids live away from home, talks to daughter frequently   Concerns Inappropriate over/under dependence on family/friends;No support system      Barriers   Psychosocial barriers to participate in program Psychosocial barriers identified (see note);The patient should benefit from training in stress management and relaxation.      Screening Interventions   Interventions Encouraged to exercise;Provide feedback about the scores to participant;To provide support and resources with identified psychosocial needs    Expected Outcomes Short Term goal: Utilizing psychosocial counselor, staff and physician to assist with identification of specific Stressors or current issues interfering with healing process. Setting desired goal for each stressor or current issue identified.;Long Term Goal: Stressors or current issues are controlled or eliminated.;Short Term goal: Identification and review with participant of any Quality of Life or Depression concerns found by scoring the questionnaire.;Long Term goal: The participant improves quality of Life and PHQ9 Scores as seen by post scores and/or verbalization of changes             Quality of Life Scores:  Quality of Life - 12/17/22 1539       Quality of Life   Select Quality of Life      Quality of Life Scores   Health/Function Pre 19.6 %    Socioeconomic Pre 26.57 %    Psych/Spiritual Pre 30 %    Family Pre 24 %    GLOBAL Pre 23.82 %            Scores of 19 and below usually indicate a poorer quality of life in these areas.  A difference of  2-3 points is a clinically meaningful difference.  A difference of 2-3 points in the  total score of the Quality of Life Index has been associated with significant improvement in overall quality of life, self-image, physical symptoms, and general health in studies assessing change in quality of life.  PHQ-9: Review Flowsheet       12/17/2022  Depression screen PHQ 2/9  Decreased Interest 1  Down, Depressed, Hopeless 1  PHQ - 2 Score 2  Altered sleeping 1  Tired, decreased energy 1  Change in appetite 0  Feeling bad or failure about yourself  0  Trouble concentrating 0  Moving slowly or fidgety/restless 0  Suicidal thoughts 0  PHQ-9 Score 4  Difficult doing work/chores Somewhat difficult    Details           Interpretation of Total Score  Total Score Depression Severity:  1-4 = Minimal depression, 5-9 = Mild depression, 10-14 = Moderate depression, 15-19 = Moderately severe  depression, 20-27 = Severe depression   Psychosocial Evaluation and Intervention:  Psychosocial Evaluation - 12/15/22 1326       Psychosocial Evaluation & Interventions   Interventions Stress management education;Relaxation education;Encouraged to exercise with the program and follow exercise prescription    Comments Chabely is coming into cardiac rehab after a STEMI and stent to D1.  She is very motivated to get started with rehab and looks forward to strengthening her heart and body.  Prior to the last year, she used to walk a lot but the hills made her stop as they were starting to hurt her back and aggravate her fibromyalgia.  She really wants to work to get back to a walking routinue again, but not have it cause pain.  She is on cymbalta for her fibromyalgia. Right after her heart attack, she was very depressed and weeping daily, but now she is starting to feel better about herself and more positive.  She has quit smoking and has been successful thus far.  Her last cigarrette was the day of her heart attack and she has not had one since.  She was commended for her success.  She quit cold  [place] as has taken up drawing and crafting in its place.  She also enjoys writing poetry.  Her health is her biggest stressor and her lack of a support system.  She has two kids.  Her daughter lives in a different part of the state and her son is in [place]. She usually talks to her daughter most days but does not like to share her stress and concerns with her as she doesn't want to add to her daughter's stress.  She does have a close friend that lives near by but again does not want to burden her friend with her stressors/concerns.  She does rely on her insurance to cover transportation for medical appointments.  Scheduling transportation can be stressful for her as it usually turns a simple appointment into a full day ordeal.  She will be using transportation to get to rehab and hopes that it will go well for her.    Expected Outcomes short: Attend rehab to exercise to build strength Long: Exercise for mood boost and attend education to learn about heart health    Continue Psychosocial Services  Follow up required by staff             Psychosocial Re-Evaluation:  Psychosocial Re-Evaluation     Row Name 01/05/23 1458             Psychosocial Re-Evaluation   Current issues with Current Psychotropic Meds;History of Depression;Current Stress Concerns       Comments Yazlyn is doing well in rehab.  She is feeling better menatlly and feels like she is on track to feeling more like herself. She is going to see foot doctor tomorrow for plantar facistis.  She is sleeping well too.  She enjoys coming to rehab and feels that it is helping her.       Expected Outcomes Short: Continue to build back to self Long: COnitnue to exercise for mental boost       Interventions Encouraged to attend Cardiac Rehabilitation for the exercise       Continue Psychosocial Services  Follow up required by staff                Psychosocial Discharge (Final Psychosocial Re-Evaluation):  Psychosocial Re-Evaluation  - 01/05/23 1458       Psychosocial Re-Evaluation  Current issues with Current Psychotropic Meds;History of Depression;Current Stress Concerns    Comments Mareta is doing well in rehab.  She is feeling better menatlly and feels like she is on track to feeling more like herself. She is going to see foot doctor tomorrow for plantar facistis.  She is sleeping well too.  She enjoys coming to rehab and feels that it is helping her.    Expected Outcomes Short: Continue to build back to self Long: COnitnue to exercise for mental boost    Interventions Encouraged to attend Cardiac Rehabilitation for the exercise    Continue Psychosocial Services  Follow up required by staff             Vocational Rehabilitation: Provide vocational rehab assistance to qualifying candidates.   Vocational Rehab Evaluation & Intervention:  Vocational Rehab - 12/15/22 1319       Initial Vocational Rehab Evaluation & Intervention   Assessment shows need for Vocational Rehabilitation No   disabled            Education: Education Goals: Education classes will be provided on a weekly basis, covering required topics. Participant will state understanding/return demonstration of topics presented.  Learning Barriers/Preferences:  Learning Barriers/Preferences - 12/15/22 1308       Learning Barriers/Preferences   Learning Barriers Sight   glasses   Learning Preferences Written Material;Video;Pictoral             Education Topics: Hypertension, Hypertension Reduction -Define heart disease and high blood pressure. Discus how high blood pressure affects the body and ways to reduce high blood pressure.   Exercise and Your Heart -Discuss why it is important to exercise, the FITT principles of exercise, normal and abnormal responses to exercise, and how to exercise safely.   Angina -Discuss definition of angina, causes of angina, treatment of angina, and how to decrease risk of having  angina.   Cardiac Medications -Review what the following cardiac medications are used for, how they affect the body, and side effects that may occur when taking the medications.  Medications include Aspirin, Beta blockers, calcium channel blockers, ACE Inhibitors, angiotensin receptor blockers, diuretics, digoxin, and antihyperlipidemics.   Congestive Heart Failure -Discuss the definition of CHF, how to live with CHF, the signs and symptoms of CHF, and how keep track of weight and sodium intake. Flowsheet Row CARDIAC REHAB PHASE II EXERCISE from 01/07/2023 in [place] CARDIAC REHABILITATION  Date 01/07/23  Educator [facility]  Instruction Review Code 1- Verbalizes Understanding       Heart Disease and Intimacy -Discus the effect sexual activity has on the heart, how changes occur during intimacy as we age, and safety during sexual activity.   Smoking Cessation / COPD -Discuss different methods to quit smoking, the health benefits of quitting smoking, and the definition of COPD.   Nutrition I: Fats -Discuss the types of cholesterol, what cholesterol does to the heart, and how cholesterol levels can be controlled. Flowsheet Row CARDIAC REHAB PHASE II EXERCISE from 01/07/2023 in [place] CARDIAC REHABILITATION  Date 12/24/22  Educator [facility]  Instruction Review Code 1- Verbalizes Understanding       Nutrition II: Labels -Discuss the different components of food labels and how to read food label   Heart Parts/Heart Disease and PAD -Discuss the anatomy of the heart, the pathway of blood circulation through the heart, and these are affected by heart disease.   Stress I: Signs and Symptoms -Discuss the causes of stress, how stress may lead to anxiety  and depression, and ways to limit stress. Flowsheet Row CARDIAC REHAB PHASE II EXERCISE from 01/07/2023 in [place] CARDIAC REHABILITATION  Date 12/31/22  Educator [facility]  Instruction Review Code 1- Verbalizes Understanding        Stress II: Relaxation -Discuss different types of relaxation techniques to limit stress.   Warning Signs of Stroke / TIA -Discuss definition of a stroke, what the signs and symptoms are of a stroke, and how to identify when someone is having stroke.   Knowledge Questionnaire Score:   Core Components/Risk Factors/Patient Goals at Admission:  Personal Goals and Risk Factors at Admission - 12/15/22 1317       Core Components/Risk Factors/Patient Goals on Admission    Weight Management Yes;Weight Maintenance    Intervention Weight Management: Develop a combined nutrition and exercise program designed to reach desired caloric intake, while maintaining appropriate intake of nutrient and fiber, sodium and fats, and appropriate energy expenditure required for the weight goal.;Weight Management: Provide education and appropriate resources to help participant work on and attain dietary goals.    Expected Outcomes Short Term: Continue to assess and modify interventions until short term weight is achieved;Long Term: Adherence to nutrition and physical activity/exercise program aimed toward attainment of established weight goal;Weight Maintenance: Understanding of the daily nutrition guidelines, which includes 25-35% calories from fat, 7% or less cal from saturated fats, less than 200mg  cholesterol, less than 1.5gm of sodium, & 5 or more servings of fruits and vegetables daily    Tobacco Cessation Yes   quit 12/02/22   Number of packs per day 0    Intervention Offer self-teaching materials, assist with locating and accessing local/national Quit Smoking programs, and support quit date choice.    Expected Outcomes Long Term: Complete abstinence from all tobacco products for at least 12 months from quit date.;Short Term: Will quit all tobacco product use, adhering to prevention of relapse plan.    Hypertension Yes    Intervention Monitor prescription use compliance.;Provide education on lifestyle  modifcations including regular physical activity/exercise, weight management, moderate sodium restriction and increased consumption of fresh fruit, vegetables, and low fat dairy, alcohol moderation, and smoking cessation.    Expected Outcomes Short Term: Continued assessment and intervention until BP is < 140/85mm HG in hypertensive participants. < 130/58mm HG in hypertensive participants with diabetes, heart failure or chronic kidney disease.;Long Term: Maintenance of blood pressure at goal levels.    Lipids Yes    Intervention Provide education and support for participant on nutrition & aerobic/resistive exercise along with prescribed medications to achieve LDL 70mg , HDL >40mg .    Expected Outcomes Short Term: Participant states understanding of desired cholesterol values and is compliant with medications prescribed. Participant is following exercise prescription and nutrition guidelines.;Long Term: Cholesterol controlled with medications as prescribed, with individualized exercise RX and with personalized nutrition plan. Value goals: LDL < 70mg , HDL > 40 mg.             Core Components/Risk Factors/Patient Goals Review:   Goals and Risk Factor Review     Row Name 01/05/23 1452             Core Components/Risk Factors/Patient Goals Review   Personal Goals Review Weight Management/Obesity;Tobacco Cessation;Hypertension;Lipids       Review Lamiya is doing well in rehab.  She missed Friday from not feeling well.  Today, her weight was up some from weekend but feels it was just from eating more over weekend. Her pressures have been running a little high  for her in the 120s when she is usually in the 110s.  She is planning to call her doctor. about bruising and pressures. She says the blood thinners are making her bruise easily.  She is doing well with cessation.  She has not had any cravings recently.  She feels like the heart event was enough to scare her away from it.       Expected Outcomes  Short: Conitnue to work on weight loss and talk to doctor Long: continue to monitor risk factors                Core Components/Risk Factors/Patient Goals at Discharge (Final Review):   Goals and Risk Factor Review - 01/05/23 1452       Core Components/Risk Factors/Patient Goals Review   Personal Goals Review Weight Management/Obesity;Tobacco Cessation;Hypertension;Lipids    Review Doreen is doing well in rehab.  She missed Friday from not feeling well.  Today, her weight was up some from weekend but feels it was just from eating more over weekend. Her pressures have been running a little high for her in the 120s when she is usually in the 110s.  She is planning to call her doctor. about bruising and pressures. She says the blood thinners are making her bruise easily.  She is doing well with cessation.  She has not had any cravings recently.  She feels like the heart event was enough to scare her away from it.    Expected Outcomes Short: Conitnue to work on weight loss and talk to doctor Long: continue to monitor risk factors             ITP Comments:  ITP Comments     Row Name 12/15/22 1325 12/17/22 1532 12/22/22 1449 01/14/23 0802     ITP Comments Completed virtual orientation today.  EP evaluation is scheduled for Wednesday 12/17/22 at 1400 .  Documentation for diagnosis can be found in [facility] encounter 12/02/22. Patient arrived for 1st visit/orientation/education at 1400. Patient was referred to CR by Dr. Tonny Bollman due to STEMI/Stent placement. During orientation advised patient on arrival and appointment times what to wear, what to do before, during and after exercise. Reviewed attendance and class policy.  Pt is scheduled to return Cardiac Rehab on 12/22/22 at 1430. Pt was advised to come to class 15 minutes before class starts.  Discussed RPE/Dpysnea scales. Patient participated in warm up stretches. Patient was able to complete 6 minute walk test.  Telemetry:NSR. Patient was  measured for the equipment. Discussed equipment safety with patient. Took patient pre-anthropometric measurements. Patient finished visit at 1500. First full day of exercise!  Patient was oriented to gym and equipment including functions, settings, policies, and procedures.  Patient's individual exercise prescription and treatment plan were reviewed.  All starting workloads were established based on the results of the 6 minute walk test done at initial orientation visit.  The plan for exercise progression was also introduced and progression will be customized based on patient's performance and goals. 30 day review completed. ITP sent to Dr. Dina Rich, Medical Director of Cardiac Rehab. Continue with ITP unless changes are made by physician.             Comments: 30 day review

## 2023-01-16 ENCOUNTER — Encounter (HOSPITAL_COMMUNITY)
Admission: RE | Admit: 2023-01-16 | Discharge: 2023-01-16 | Disposition: A | Discharge status: Home / Self Care | Payer: Medicaid Other | Source: Ambulatory Visit | Attending: Cardiovascular Disease

## 2023-01-16 DIAGNOSIS — I2129 ST elevation (STEMI) myocardial infarction involving other sites: Secondary | ICD-10-CM | POA: Diagnosis not present

## 2023-01-16 DIAGNOSIS — Z955 Presence of coronary angioplasty implant and graft: Secondary | ICD-10-CM

## 2023-01-16 NOTE — Progress Notes (Signed)
Daily Session Note  Patient Details  Name: Sarah Vargas MRN: [id number] Date of Birth: 1960-09-17 Referring Provider:   Flowsheet Row CARDIAC REHAB PHASE II ORIENTATION from 12/17/2022 in [facility] CARDIAC REHABILITATION  Referring Provider Tonny Bollman MD       Encounter Date: 01/16/2023  Check In:  Session Check In - 01/16/23 1415       Check-In   Supervising physician immediately available to respond to emergencies See telemetry face sheet for immediately available ER MD    Location [facility]    Staff Present Fabio Pierce, MA, RCEP, CCRP, Dow Adolph, RN, BSN    Virtual Visit No    Medication changes reported     No    Fall or balance concerns reported    No    Warm-up and Cool-down Performed on first and last piece of equipment    Resistance Training Performed Yes    VAD Patient? No    PAD/SET Patient? No      Pain Assessment   Currently in Pain? No/denies    Pain Score 0-No pain    Multiple Pain Sites No             Capillary Blood Glucose: No results found for this or any previous visit (from the past 24 hour(s)).    Social History   Tobacco Use  Smoking Status Former   Current packs/day: 0.00   Average packs/day: 0.5 packs/day for 30.0 years (15.0 ttl pk-yrs)   Types: Cigarettes   Quit date: 12/02/2022   Years since quitting: 0.1  Smokeless Tobacco Never  Tobacco Comments   Quit 12/02/22    Goals Met:  Independence with exercise equipment Exercise tolerated well No report of concerns or symptoms today Strength training completed today  Goals Unmet:  Not Applicable  Comments: Pt able to follow exercise prescription today without complaint.  Will continue to monitor for progression.

## 2023-01-19 ENCOUNTER — Encounter (HOSPITAL_COMMUNITY)
Admission: RE | Admit: 2023-01-19 | Discharge: 2023-01-19 | Disposition: A | Discharge status: Home / Self Care | Payer: Medicaid Other | Source: Ambulatory Visit | Attending: Cardiovascular Disease

## 2023-01-19 DIAGNOSIS — I2129 ST elevation (STEMI) myocardial infarction involving other sites: Secondary | ICD-10-CM | POA: Diagnosis not present

## 2023-01-19 DIAGNOSIS — Z955 Presence of coronary angioplasty implant and graft: Secondary | ICD-10-CM

## 2023-01-19 NOTE — Progress Notes (Signed)
Daily Session Note  Patient Details  Name: Sarah Vargas MRN: [id number] Date of Birth: 15-Oct-1960 Referring Provider:   Flowsheet Row CARDIAC REHAB PHASE II ORIENTATION from 12/17/2022 in [facility] CARDIAC REHABILITATION  Referring Provider Tonny Bollman MD       Encounter Date: 01/19/2023  Check In:  Session Check In - 01/19/23 1430       Check-In   Supervising physician immediately available to respond to emergencies See telemetry face sheet for immediately available MD    Location [facility]    Staff Present Erskine Speed, RN;Jessica [place], MA, RCEP, CCRP, Dow Adolph, RN, BSN    Virtual Visit No    Medication changes reported     No    Fall or balance concerns reported    No    Tobacco Cessation No Change    Warm-up and Cool-down Performed as group-led Writer Performed Yes    VAD Patient? No    PAD/SET Patient? No      Pain Assessment   Currently in Pain? No/denies    Pain Score 0-No pain    Multiple Pain Sites No             Capillary Blood Glucose: No results found for this or any previous visit (from the past 24 hour(s)).    Social History   Tobacco Use  Smoking Status Former   Current packs/day: 0.00   Average packs/day: 0.5 packs/day for 30.0 years (15.0 ttl pk-yrs)   Types: Cigarettes   Quit date: 12/02/2022   Years since quitting: 0.1  Smokeless Tobacco Never  Tobacco Comments   Quit 12/02/22    Goals Met:  Independence with exercise equipment Exercise tolerated well No report of concerns or symptoms today Strength training completed today  Goals Unmet:  Not Applicable  Comments: Pt able to follow exercise prescription today without complaint.  Will continue to monitor for progression.

## 2023-01-21 ENCOUNTER — Encounter (HOSPITAL_COMMUNITY)
Admission: RE | Admit: 2023-01-21 | Discharge: 2023-01-21 | Disposition: A | Discharge status: Home / Self Care | Payer: Medicaid Other | Source: Ambulatory Visit | Attending: Cardiovascular Disease | Admitting: Cardiovascular Disease

## 2023-01-21 DIAGNOSIS — I2129 ST elevation (STEMI) myocardial infarction involving other sites: Secondary | ICD-10-CM | POA: Diagnosis not present

## 2023-01-21 DIAGNOSIS — Z955 Presence of coronary angioplasty implant and graft: Secondary | ICD-10-CM

## 2023-01-21 NOTE — Progress Notes (Signed)
Daily Session Note  Patient Details  Name: NIKEISHA ELLYSON MRN: [id number] Date of Birth: 1960/05/15 Referring Provider:   Flowsheet Row CARDIAC REHAB PHASE II ORIENTATION from 12/17/2022 in [facility] CARDIAC REHABILITATION  Referring Provider Tonny Bollman MD       Encounter Date: 01/21/2023  Check In:  Session Check In - 01/21/23 1400       Check-In   Supervising physician immediately available to respond to emergencies See telemetry face sheet for immediately available ER MD    Location [facility]    Staff Present Fabio Pierce, MA, RCEP, CCRP, Dow Adolph, RN, BSN;Heather Fredric Mare, BS, Exercise Physiologist    Virtual Visit No    Medication changes reported     No    Fall or balance concerns reported    No    Warm-up and Cool-down Performed on first and last piece of equipment    Resistance Training Performed Yes    VAD Patient? No    PAD/SET Patient? No      Pain Assessment   Currently in Pain? No/denies    Pain Score 0-No pain    Multiple Pain Sites No             Capillary Blood Glucose: No results found for this or any previous visit (from the past 24 hour(s)).    Social History   Tobacco Use  Smoking Status Former   Current packs/day: 0.00   Average packs/day: 0.5 packs/day for 30.0 years (15.0 ttl pk-yrs)   Types: Cigarettes   Quit date: 12/02/2022   Years since quitting: 0.1  Smokeless Tobacco Never  Tobacco Comments   Quit 12/02/22    Goals Met:  Independence with exercise equipment Exercise tolerated well No report of concerns or symptoms today Strength training completed today  Goals Unmet:  Not Applicable  Comments: Pt able to follow exercise prescription today without complaint.  Will continue to monitor for progression.    Dr. Dominga Ferry Medical Director

## 2023-01-23 ENCOUNTER — Encounter (HOSPITAL_COMMUNITY)
Admission: RE | Admit: 2023-01-23 | Discharge: 2023-01-23 | Disposition: A | Discharge status: Home / Self Care | Payer: Medicaid Other | Source: Ambulatory Visit | Attending: Cardiovascular Disease | Admitting: Cardiovascular Disease

## 2023-01-23 DIAGNOSIS — I2129 ST elevation (STEMI) myocardial infarction involving other sites: Secondary | ICD-10-CM

## 2023-01-23 DIAGNOSIS — Z955 Presence of coronary angioplasty implant and graft: Secondary | ICD-10-CM

## 2023-01-23 NOTE — Progress Notes (Signed)
Daily Session Note  Patient Details  Name: ELANIE FERDON MRN: [id number] Date of Birth: 01-05-1961 Referring Provider:   Flowsheet Row CARDIAC REHAB PHASE II ORIENTATION from 12/17/2022 in [facility] CARDIAC REHABILITATION  Referring Provider Tonny Bollman MD       Encounter Date: 01/23/2023  Check In:  Session Check In - 01/23/23 1415       Check-In   Supervising physician immediately available to respond to emergencies See telemetry face sheet for immediately available ER MD    Location [facility]    Staff Present Ross Ludwig, BS, Exercise Physiologist;Naamah Boggess Laural Benes, RN, BSN    Virtual Visit No    Medication changes reported     No    Fall or balance concerns reported    No    Warm-up and Cool-down Performed on first and last piece of equipment    Resistance Training Performed Yes    VAD Patient? No    PAD/SET Patient? No      Pain Assessment   Currently in Pain? No/denies    Pain Score 0-No pain    Multiple Pain Sites No             Capillary Blood Glucose: No results found for this or any previous visit (from the past 24 hour(s)).    Social History   Tobacco Use  Smoking Status Former   Current packs/day: 0.00   Average packs/day: 0.5 packs/day for 30.0 years (15.0 ttl pk-yrs)   Types: Cigarettes   Quit date: 12/02/2022   Years since quitting: 0.1  Smokeless Tobacco Never  Tobacco Comments   Quit 12/02/22    Goals Met:  Independence with exercise equipment Personal goals reviewed No report of concerns or symptoms today Strength training completed today  Goals Unmet:  Not Applicable  Comments: Pt able to follow exercise prescription today without complaint.  Will continue to monitor for progression.

## 2023-01-26 ENCOUNTER — Encounter (HOSPITAL_COMMUNITY): Payer: Medicaid Other

## 2023-01-28 ENCOUNTER — Encounter (HOSPITAL_COMMUNITY)
Admission: RE | Admit: 2023-01-28 | Discharge: 2023-01-28 | Disposition: A | Discharge status: Home / Self Care | Payer: Medicaid Other | Source: Ambulatory Visit | Attending: Cardiovascular Disease

## 2023-01-28 DIAGNOSIS — I2129 ST elevation (STEMI) myocardial infarction involving other sites: Secondary | ICD-10-CM | POA: Diagnosis not present

## 2023-01-28 DIAGNOSIS — Z955 Presence of coronary angioplasty implant and graft: Secondary | ICD-10-CM

## 2023-01-28 NOTE — Progress Notes (Signed)
Daily Session Note  Patient Details  Name: Sarah Vargas MRN: [id number] Date of Birth: 28-Nov-1960 Referring Provider:   Flowsheet Row CARDIAC REHAB PHASE II ORIENTATION from 12/17/2022 in [facility] CARDIAC REHABILITATION  Referring Provider Tonny Bollman MD       Encounter Date: 01/28/2023  Check In:  Session Check In - 01/28/23 1400       Check-In   Supervising physician immediately available to respond to emergencies See telemetry face sheet for immediately available MD    Location [facility]    Staff Present Ross Ludwig, BS, Exercise Physiologist;Jessica Juanetta [place]ling, MA, RCEP, CCRP, CCET    Virtual Visit No    Medication changes reported     No    Fall or balance concerns reported    No    Tobacco Cessation No Change    Warm-up and Cool-down Performed on first and last piece of equipment    Resistance Training Performed Yes    VAD Patient? No    PAD/SET Patient? No      Pain Assessment   Currently in Pain? No/denies    Pain Score 0-No pain    Multiple Pain Sites No             Capillary Blood Glucose: No results found for this or any previous visit (from the past 24 hour(s)).    Social History   Tobacco Use  Smoking Status Former   Current packs/day: 0.00   Average packs/day: 0.5 packs/day for 30.0 years (15.0 ttl pk-yrs)   Types: Cigarettes   Quit date: 12/02/2022   Years since quitting: 0.1  Smokeless Tobacco Never  Tobacco Comments   Quit 12/02/22    Goals Met:  Independence with exercise equipment Exercise tolerated well No report of concerns or symptoms today Strength training completed today  Goals Unmet:  Not Applicable  Comments: Pt able to follow exercise prescription today without complaint.  Will continue to monitor for progression.

## 2023-01-30 ENCOUNTER — Encounter (HOSPITAL_COMMUNITY)
Admission: RE | Admit: 2023-01-30 | Discharge: 2023-01-30 | Disposition: A | Discharge status: Home / Self Care | Payer: Medicaid Other | Source: Ambulatory Visit | Attending: Cardiovascular Disease

## 2023-01-30 DIAGNOSIS — Z955 Presence of coronary angioplasty implant and graft: Secondary | ICD-10-CM

## 2023-01-30 DIAGNOSIS — I2129 ST elevation (STEMI) myocardial infarction involving other sites: Secondary | ICD-10-CM

## 2023-01-30 NOTE — Progress Notes (Signed)
Daily Session Note  Patient Details  Name: Sarah Vargas MRN: [id number] Date of Birth: 21-Sep-1960 Referring Provider:   Flowsheet Row CARDIAC REHAB PHASE II ORIENTATION from 12/17/2022 in [facility] CARDIAC REHABILITATION  Referring Provider Tonny Bollman MD       Encounter Date: 01/30/2023  Check In:  Session Check In - 01/30/23 1415       Check-In   Supervising physician immediately available to respond to emergencies See telemetry face sheet for immediately available ER MD    Location [facility]    Staff Present Rodena Medin, RN, BSN;Heather Fredric Mare, BS, Exercise Physiologist    Virtual Visit No    Medication changes reported     No    Fall or balance concerns reported    No    Warm-up and Cool-down Performed on first and last piece of equipment    Resistance Training Performed Yes    VAD Patient? No    PAD/SET Patient? No      Pain Assessment   Currently in Pain? No/denies    Pain Score 0-No pain    Multiple Pain Sites No             Capillary Blood Glucose: No results found for this or any previous visit (from the past 24 hour(s)).    Social History   Tobacco Use  Smoking Status Former   Current packs/day: 0.00   Average packs/day: 0.5 packs/day for 30.0 years (15.0 ttl pk-yrs)   Types: Cigarettes   Quit date: 12/02/2022   Years since quitting: 0.1  Smokeless Tobacco Never  Tobacco Comments   Quit 12/02/22    Goals Met:  Independence with exercise equipment Exercise tolerated well No report of concerns or symptoms today Strength training completed today  Goals Unmet:  Not Applicable  Comments: Pt able to follow exercise prescription today without complaint.  Will continue to monitor for progression.

## 2023-02-02 ENCOUNTER — Encounter (HOSPITAL_COMMUNITY)
Admission: RE | Admit: 2023-02-02 | Discharge: 2023-02-02 | Disposition: A | Discharge status: Home / Self Care | Payer: Medicaid Other | Source: Ambulatory Visit | Attending: Cardiovascular Disease | Admitting: Cardiovascular Disease

## 2023-02-02 DIAGNOSIS — I2129 ST elevation (STEMI) myocardial infarction involving other sites: Secondary | ICD-10-CM

## 2023-02-02 DIAGNOSIS — Z955 Presence of coronary angioplasty implant and graft: Secondary | ICD-10-CM

## 2023-02-02 NOTE — Progress Notes (Signed)
Daily Session Note  Patient Details  Name: Sarah Vargas MRN: [id number] Date of Birth: 1960-05-06 Referring Provider:   Flowsheet Row CARDIAC REHAB PHASE II ORIENTATION from 12/17/2022 in [facility] CARDIAC REHABILITATION  Referring Provider Tonny Bollman MD       Encounter Date: 02/02/2023  Check In:  Session Check In - 02/02/23 1428       Check-In   Supervising physician immediately available to respond to emergencies See telemetry face sheet for immediately available MD    Location [facility]    Staff Present Ross Ludwig, BS, Exercise Physiologist;Jessica Juanetta Gosling, MA, RCEP, CCRP, CCET    Virtual Visit No    Medication changes reported     No    Fall or balance concerns reported    No    Tobacco Cessation No Change    Warm-up and Cool-down Performed on first and last piece of equipment    Resistance Training Performed Yes    VAD Patient? No    PAD/SET Patient? No      Pain Assessment   Currently in Pain? No/denies    Pain Score 0-No pain    Multiple Pain Sites No             Capillary Blood Glucose: No results found for this or any previous visit (from the past 24 hour(s)).    Social History   Tobacco Use  Smoking Status Former   Current packs/day: 0.00   Average packs/day: 0.5 packs/day for 30.0 years (15.0 ttl pk-yrs)   Types: Cigarettes   Quit date: 12/02/2022   Years since quitting: 0.1  Smokeless Tobacco Never  Tobacco Comments   Quit 12/02/22    Goals Met:  Independence with exercise equipment Exercise tolerated well No report of concerns or symptoms today   Goals Unmet:  Not Applicable  Comments: Pt able to follow exercise prescription today without complaint.  Will continue to monitor for progression.

## 2023-02-04 ENCOUNTER — Encounter (HOSPITAL_COMMUNITY)
Admission: RE | Admit: 2023-02-04 | Discharge: 2023-02-04 | Disposition: A | Discharge status: Home / Self Care | Payer: Medicaid Other | Source: Ambulatory Visit | Attending: Cardiovascular Disease

## 2023-02-04 DIAGNOSIS — Z955 Presence of coronary angioplasty implant and graft: Secondary | ICD-10-CM

## 2023-02-04 DIAGNOSIS — I2129 ST elevation (STEMI) myocardial infarction involving other sites: Secondary | ICD-10-CM | POA: Diagnosis not present

## 2023-02-04 NOTE — Progress Notes (Signed)
Daily Session Note  Patient Details  Name: Sarah Vargas MRN: [id number] Date of Birth: [birth year] Referring Provider:   Flowsheet Row CARDIAC REHAB PHASE II ORIENTATION from 12/17/2022 in [facility] CARDIAC REHABILITATION  Referring Provider Tonny Bollman MD       Encounter Date: 02/04/2023  Check In:  Session Check In - 02/04/23 1100       Check-In   Supervising physician immediately available to respond to emergencies See telemetry face sheet for immediately available MD    Location [facility]    Staff Present Ross Ludwig, BS, Exercise Physiologist   Brooke, RN   Virtual Visit No    Medication changes reported     No    Fall or balance concerns reported    No    Tobacco Cessation No Change    Warm-up and Cool-down Performed on first and last piece of equipment    Resistance Training Performed Yes    VAD Patient? No    PAD/SET Patient? No      Pain Assessment   Currently in Pain? No/denies    Pain Score 0-No pain    Multiple Pain Sites No             Capillary Blood Glucose: No results found for this or any previous visit (from the past 24 hour(s)).    Social History   Tobacco Use  Smoking Status Former   Current packs/day: 0.00   Average packs/day: 0.5 packs/day for 30.0 years (15.0 ttl pk-yrs)   Types: Cigarettes   Quit date: 12/02/2022   Years since quitting: 0.1  Smokeless Tobacco Never  Tobacco Comments   Quit 12/02/22    Goals Met:  Independence with exercise equipment Exercise tolerated well No report of concerns or symptoms today Strength training completed today  Goals Unmet:  Not Applicable  Comments: Pt able to follow exercise prescription today without complaint.  Will continue to monitor for progression.

## 2023-02-09 ENCOUNTER — Encounter (HOSPITAL_COMMUNITY)
Admission: RE | Admit: 2023-02-09 | Discharge: 2023-02-09 | Disposition: A | Discharge status: Home / Self Care | Payer: Medicaid Other | Source: Ambulatory Visit | Attending: Cardiovascular Disease | Admitting: Cardiovascular Disease

## 2023-02-09 DIAGNOSIS — Z955 Presence of coronary angioplasty implant and graft: Secondary | ICD-10-CM | POA: Diagnosis present

## 2023-02-09 DIAGNOSIS — I2129 ST elevation (STEMI) myocardial infarction involving other sites: Secondary | ICD-10-CM | POA: Diagnosis present

## 2023-02-09 NOTE — Progress Notes (Signed)
Daily Session Note  Patient Details  Name: Sarah Vargas MRN: [id number] Date of Birth: 1960/04/21 Referring Provider:   Flowsheet Row CARDIAC REHAB PHASE II ORIENTATION from 12/17/2022 in [facility] CARDIAC REHABILITATION  Referring Provider Tonny Bollman MD       Encounter Date: 02/09/2023  Check In:  Session Check In - 02/09/23 1429       Check-In   Supervising physician immediately available to respond to emergencies See telemetry face sheet for immediately available MD    Location [facility]    Staff Present Ross Ludwig, BS, Exercise Physiologist;Kirsta Probert, RN;Jessica [place], MA, RCEP, CCRP, Dow Adolph, RN, BSN    Virtual Visit No    Medication changes reported     No    Fall or balance concerns reported    No    Tobacco Cessation No Change    Warm-up and Cool-down Performed on first and last piece of equipment    Resistance Training Performed Yes    VAD Patient? No    PAD/SET Patient? No      Pain Assessment   Currently in Pain? No/denies    Pain Score 0-No pain    Multiple Pain Sites No             Capillary Blood Glucose: No results found for this or any previous visit (from the past 24 hour(s)).    Social History   Tobacco Use  Smoking Status Former   Current packs/day: 0.00   Average packs/day: 0.5 packs/day for 30.0 years (15.0 ttl pk-yrs)   Types: Cigarettes   Quit date: 12/02/2022   Years since quitting: 0.1  Smokeless Tobacco Never  Tobacco Comments   Quit 12/02/22    Goals Met:  Independence with exercise equipment Exercise tolerated well No report of concerns or symptoms today Strength training completed today  Goals Unmet:  Not Applicable  Comments: Pt able to follow exercise prescription today without complaint.  Will continue to monitor for progression.

## 2023-02-11 ENCOUNTER — Encounter (HOSPITAL_COMMUNITY): Payer: Self-pay | Admitting: *Deleted

## 2023-02-11 ENCOUNTER — Encounter (HOSPITAL_COMMUNITY)
Admission: RE | Admit: 2023-02-11 | Discharge: 2023-02-11 | Disposition: A | Discharge status: Home / Self Care | Payer: Medicaid Other | Source: Ambulatory Visit | Attending: Cardiovascular Disease | Admitting: Cardiovascular Disease

## 2023-02-11 DIAGNOSIS — I2129 ST elevation (STEMI) myocardial infarction involving other sites: Secondary | ICD-10-CM

## 2023-02-11 DIAGNOSIS — Z955 Presence of coronary angioplasty implant and graft: Secondary | ICD-10-CM

## 2023-02-11 NOTE — Progress Notes (Signed)
Cardiac Individual Treatment Plan  Patient Details  Name: Sarah Vargas MRN: [id number] Date of Birth: 03/15/1960 Referring Provider:   Flowsheet Row CARDIAC REHAB PHASE II ORIENTATION from 12/17/2022 in [facility] CARDIAC REHABILITATION  Referring Provider Tonny Bollman MD       Initial Encounter Date:  Flowsheet Row CARDIAC REHAB PHASE II ORIENTATION from 12/17/2022 in [place] CARDIAC REHABILITATION  Date 12/17/22       Visit Diagnosis: ST elevation myocardial infarction (STEMI) involving other coronary artery [facility])  Status post coronary artery stent placement  Patient's Home Medications on Admission:  Current Outpatient Medications:    alendronate (FOSAMAX) 70 MG tablet, Take 70 mg by mouth once a week. Take on empty stomach with full glass of water, sit upright for 1 hour after., Disp: , Rfl:    aspirin EC 81 MG tablet, Take 1 tablet (81 mg total) by mouth daily. Swallow whole., Disp: 120 tablet, Rfl: 12   baclofen (LIORESAL) 10 MG tablet, Take 10 mg by mouth 3 (three) times daily., Disp: , Rfl:    Calcium Carb-Cholecalciferol (CALCIUM 600 + D PO), Take 1 tablet by mouth in the morning and at bedtime., Disp: , Rfl:    DULoxetine (CYMBALTA) 30 MG capsule, Take 30 mg by mouth See admin instructions. Take with Duloxetine 60mg  to equal 90mg  total., Disp: , Rfl:    DULoxetine (CYMBALTA) 60 MG capsule, Take 60 mg by mouth See admin instructions. Take with Duloxetine 30mg  to equal 90mg  total., Disp: , Rfl:    loratadine (CLARITIN) 10 MG tablet, Take 10 mg by mouth daily., Disp: , Rfl:    metoprolol succinate (TOPROL-XL) 25 MG 24 hr tablet, Take 0.5 tablets (12.5 mg total) by mouth daily., Disp: 45 tablet, Rfl: 0   Multiple Vitamin (MULTIVITAMIN) tablet, Take 1 tablet by mouth daily., Disp: , Rfl:    nitroGLYCERIN (NITROSTAT) 0.4 MG SL tablet, Place 1 tablet (0.4 mg total) under the tongue every 5 (five) minutes as needed for chest pain., Disp: 25 tablet, Rfl: 3   oxybutynin  (DITROPAN) 5 MG tablet, Take 5 mg by mouth daily. (Patient not taking: Reported on 12/17/2022), Disp: , Rfl:    pantoprazole (PROTONIX) 40 MG tablet, Take 1 tablet (40 mg total) by mouth daily., Disp: 90 tablet, Rfl: 1   rosuvastatin (CRESTOR) 40 MG tablet, Take 1 tablet (40 mg total) by mouth daily., Disp: 90 tablet, Rfl: 1   ticagrelor (BRILINTA) 90 MG TABS tablet, Take 1 tablet (90 mg total) by mouth 2 (two) times daily., Disp: 180 tablet, Rfl: 3   topiramate (TOPAMAX) 50 MG tablet, Take 50 mg by mouth 2 (two) times daily., Disp: , Rfl:    traZODone (DESYREL) 150 MG tablet, Take 150 mg by mouth at bedtime., Disp: , Rfl:    triamcinolone ointment (KENALOG) 0.5 %, Apply 1 Application topically 2 (two) times daily., Disp: , Rfl:   Past Medical History: Past Medical History:  Diagnosis Date   Back pain    Chest pain    12/2010.   Fibromyalgia    Generalized headaches    GERD (gastroesophageal reflux disease) 12/10/2010   HTN (hypertension) 12/10/2010   Kidney stones    s/p ureteral stents-removed in past.   LBP (low back pain) 12/10/2010   Osteoarthritis    Positive H. pylori test 12/10/2010   Treated 2011.   STEMI (ST elevation myocardial infarction) (HCC) 12/02/2022   Tobacco abuse 12/10/2010    Tobacco Use: Social History   Tobacco Use  Smoking Status Former   Current packs/day: 0.00   Average packs/day: 0.5 packs/day for 30.0 years (15.0 ttl pk-yrs)   Types: Cigarettes   Quit date: 12/02/2022   Years since quitting: 0.1  Smokeless Tobacco Never  Tobacco Comments   Quit 12/02/22    Labs: Review Flowsheet       Latest Ref Rng & Units 12/11/2010 12/02/2022  Labs for ITP Cardiac and Pulmonary Rehab  Cholestrol 0 - 200 mg/dL 161  096   LDL (calc) 0 - 99 mg/dL 045  80   HDL-C >40 mg/dL 37  54   Trlycerides <981 mg/dL 191  478   Hemoglobin G9F 4.8 - 5.6 % - 5.5   TCO2 22 - 32 mmol/L - 20     Details            Capillary Blood Glucose: No results found for:  "GLUCAP"   Exercise Target Goals: Exercise Program Goal: Individual exercise prescription set using results from initial 6 min walk test and THRR while considering  patient's activity barriers and safety.   Exercise Prescription Goal: Starting with aerobic activity 30 plus minutes a day, 3 days per week for initial exercise prescription. Provide home exercise prescription and guidelines that participant acknowledges understanding prior to discharge.  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 HR 80 bpm     Max Ex. BP 120/70     2 Minute Post BP 108/70              Oxygen Initial Assessment:   Oxygen Re-Evaluation:   Oxygen Discharge (Final Oxygen Re-Evaluation):   Initial Exercise Prescription:  Initial Exercise Prescription - 12/17/22 1500       Date of Initial Exercise RX and Referring Provider   Date 12/17/22    Referring Provider Tonny Bollman MD      Oxygen   Maintain Oxygen Saturation 88% or higher      Prescription Details   Frequency (times per week) 3    Duration Progress to 30 minutes of continuous aerobic without signs/symptoms of physical distress      Intensity   THRR 40-80% of Max Heartrate  109-142    Ratings of Perceived Exertion 11-13    Perceived Dyspnea 0-4      Resistance Training   Training Prescription Yes    Weight 4 lbs /  bluw band    Reps 10-15             Perform Capillary Blood Glucose checks as needed.  Exercise Prescription Changes:   Exercise Prescription Changes     Row Name 12/17/22 1500 01/05/23 1500 01/19/23 1200 01/28/23 1500 02/09/23 1500     Response to Exercise   Blood Pressure (Admit) 100/70 -- 128/60 120/80 122/60   Blood Pressure (Exercise) 120/70 -- -- -- --   Blood Pressure (Exit) 108/70 -- 110/60 118/70 120/62   Heart Rate (Admit) 76 bpm -- 78 bpm 76 bpm 70 bpm   Heart Rate (Exercise) 80 bpm -- 109 bpm 110 bpm 110 bpm   Heart Rate (Exit) 70 bpm -- 87 bpm 102 bpm 75 bpm   Oxygen Saturation (Admit) 97 % -- -- -- --   Oxygen Saturation (Exercise) 95 % -- -- -- --   Oxygen Saturation (Exit) 95 % -- -- -- --   Rating of Perceived Exertion (Exercise) 13 -- 13 13 13    Perceived Dyspnea (Exercise) 1 -- -- -- --   Duration -- -- Continue with 30 min of aerobic exercise without signs/symptoms of physical distress. Continue with 30 min of aerobic exercise without signs/symptoms of physical distress. Continue with 30 min of aerobic exercise without signs/symptoms of physical distress.   Intensity -- -- THRR unchanged THRR unchanged THRR unchanged     Progression   Progression -- -- Continue to progress workloads to maintain intensity without signs/symptoms of physical distress. Continue to progress workloads to maintain intensity without signs/symptoms of physical distress. Continue to progress workloads to maintain intensity without signs/symptoms of physical distress.     Resistance Training   Training Prescription -- -- Yes Yes Yes   Weight -- -- 4 lbs 4 lbs / red band 3 lbs   Reps -- -- 10-15 10-15 10-15     Treadmill   MPH 1.4 -- 1.8 1.5 1.9   Grade 0 -- 1 1 1    Minutes 15 -- 15 15 15    METs 2.4 -- 2.63 2.35 2.72     NuStep    Level 2 -- 2 3 3    SPM 50 -- 74 83 80   Minutes 15 -- 15 15 15    METs 1.8 -- 2.2 2.3 2     Home Exercise Plan   Plans to continue exercise at -- Home (comment)  walking, staff videos Home (comment) Home (comment) Home (comment)   Frequency -- Add 2 additional days to program exercise sessions. Add 2 additional days to program exercise sessions. Add 2 additional days to program exercise sessions. Add 2 additional days to program exercise sessions.   Initial Home Exercises Provided -- 01/05/23 -- -- --     Oxygen   Maintain Oxygen Saturation -- -- -- 88% or higher 88% or higher            Exercise Comments:   Exercise Goals and Review:   Exercise Goals     Row Name 12/15/22 1325 12/17/22 1525           Exercise Goals   Increase Physical Activity Yes Yes      Intervention Provide advice, education, support and counseling about physical activity/exercise needs.;Develop an individualized exercise prescription for aerobic and resistive training based on initial evaluation findings, risk stratification, comorbidities and participant's personal goals. --      Expected Outcomes Short Term: Attend rehab on a regular basis to increase amount of physical activity.;Long Term:  Add in home exercise to make exercise part of routine and to increase amount of physical activity.;Long Term: Exercising regularly at least 3-5 days a week. --      Increase Strength and Stamina Yes --      Intervention Provide advice, education, support and counseling about physical activity/exercise needs.;Develop an individualized exercise prescription for aerobic and resistive training based on initial evaluation findings, risk stratification, comorbidities and participant's personal goals. --      Expected Outcomes Short Term: Increase workloads from initial exercise prescription for resistance, speed, and METs.;Short Term: Perform resistance training exercises routinely during rehab and add in resistance training  at home;Long Term: Improve cardiorespiratory fitness, muscular endurance and strength as measured by increased METs and functional capacity ( ) --      Able to understand and use rate of perceived exertion (RPE) scale Yes --      Intervention Provide education and explanation on how to use RPE scale --      Expected Outcomes Short Term: Able to use RPE daily in rehab to express subjective intensity level;Long Term:  Able to use RPE to guide intensity level when exercising independently --      Able to understand and use Dyspnea scale Yes --      Intervention Provide education and explanation on how to use Dyspnea scale --      Expected Outcomes Short Term: Able to use Dyspnea scale daily in rehab to express subjective sense of shortness of breath during exertion;Long Term: Able to use Dyspnea scale to guide intensity level when exercising independently --      Knowledge and understanding of Target Heart Rate Range (THRR) Yes --      Intervention Provide education and explanation of THRR including how the numbers were predicted and where they are located for reference --      Expected Outcomes Short Term: Able to state/look up THRR;Long Term: Able to use THRR to govern intensity when exercising independently;Short Term: Able to use daily as guideline for intensity in rehab --      Able to check pulse independently Yes --      Intervention Provide education and demonstration on how to check pulse in carotid and radial arteries.;Review the importance of being able to check your own pulse for safety during independent exercise --      Expected Outcomes Short Term: Able to explain why pulse checking is important during independent exercise;Long Term: Able to check pulse independently and accurately --      Understanding of Exercise Prescription Yes --      Intervention Provide education, explanation, and written materials on patient's individual exercise prescription --      Expected Outcomes Short  Term: Able to explain program exercise prescription;Long Term: Able to explain home exercise prescription to exercise independently --               Exercise Goals Re-Evaluation :  Exercise Goals Re-Evaluation     Row Name 01/05/23 1500 01/19/23 1441 01/21/23 0825         Exercise Goal Re-Evaluation   Exercise Goals Review Increase Physical Activity;Increase Strength and Stamina;Understanding of Exercise Prescription Increase Physical Activity;Increase Strength and Stamina;Understanding of Exercise Prescription Increase Physical Activity;Able to understand and use rate of perceived exertion (RPE) scale;Understanding of Exercise Prescription     Comments Carice is doing well in rehab.  She is feeling good and already walking at home.  She has noted that it can be tough with hills.  She will stay on the sidewalk and just sticking between the hills. Reviewed home exercise with pt today.  Pt plans to walk and use staff videos at home for exercise.  Reviewed THR, pulse, RPE, sign and symptoms, pulse oximetery and when to call 911 or MD.  Also discussed weather considerations and indoor options.  Pt voiced understanding. Bali is doing well in rehab.  She is feeling good.  She is walking on her off days.  She goes up and down hills in the neighborhood across this street.  She feels like her stamina is starting to get better.  She has not gotten to weights yet, but been unpacking from her remodel. Edan is doing well in rehab. She has increased her level on the stepper to level 2. Will continue to monitor and progress as able.     Expected Outcomes Short: Start to add in more walking at home Long: Continue to improve stamina Short: Continue to walk on off days and add in weights Long: Conitnue to add in more walking at home. Short: increase treadmill speed  long term: continue to attend rehab               Discharge Exercise Prescription (Final Exercise Prescription Changes):  Exercise  Prescription Changes - 02/09/23 1500       Response to Exercise   Blood Pressure (Admit) 122/60    Blood Pressure (Exit) 120/62    Heart Rate (Admit) 70 bpm    Heart Rate (Exercise) 110 bpm    Heart Rate (Exit) 75 bpm    Rating of Perceived Exertion (Exercise) 13    Duration Continue with 30 min of aerobic exercise without signs/symptoms of physical distress.    Intensity THRR unchanged      Progression   Progression Continue to progress workloads to maintain intensity without signs/symptoms of physical distress.      Resistance Training   Training Prescription Yes    Weight 3 lbs    Reps 10-15      Treadmill   MPH 1.9    Grade 1    Minutes 15    METs 2.72      NuStep   Level 3    SPM 80    Minutes 15    METs 2      Home Exercise Plan   Plans to continue exercise at Home (comment)    Frequency Add 2 additional days to program exercise sessions.      Oxygen   Maintain Oxygen Saturation 88% or higher             Nutrition:  Target Goals: Understanding of nutrition guidelines, daily intake of sodium 1500mg , cholesterol 200mg , calories 30% from fat and 7% or less from saturated fats, daily to have 5 or more servings of fruits and vegetables.  Biometrics:  Pre Biometrics - 12/17/22 1525       Pre Biometrics   Height 5\' 4"  (1.626 m)    Weight 138 lb 14.2 oz (63 kg)    Waist Circumference 36 inches    Hip Circumference 40 inches    Waist to Hip Ratio 0.9 %    BMI (Calculated) 23.83    Grip Strength 14.2 kg    Single Leg Stand 5.75 seconds              Nutrition Therapy Plan and Nutrition Goals:  Nutrition Therapy & Goals - 12/15/22 1326       Nutrition Therapy   Diet  DASH Diet: Has purchased book to learn more about it.      Intervention Plan   Intervention Nutrition handout(s) given to patient.    Expected Outcomes Short Term Goal: Understand basic principles of dietary content, such as calories, fat, sodium, cholesterol and nutrients.;Long  Term Goal: Adherence to prescribed nutrition plan.             Nutrition Assessments:  MEDIFICTS Score Key: >=70 Need to make dietary changes  40-70 Heart Healthy Diet <= 40 Therapeutic Level Cholesterol Diet  Flowsheet Row CARDIAC REHAB PHASE II ORIENTATION from 12/17/2022 in [facility] CARDIAC REHABILITATION  Picture Your Plate Total Score on Admission 28      Picture Your Plate Scores: <96 Unhealthy dietary pattern with much room for improvement. 41-50 Dietary pattern unlikely to meet recommendations for good health and room for improvement. 51-60 More healthful dietary pattern, with some room for improvement.  >60 Healthy dietary pattern, although there may be some specific behaviors that could be improved.    Nutrition Goals Re-Evaluation:  Nutrition Goals Re-Evaluation     Row Name 01/05/23 1455 01/19/23 1445           Goals   Nutrition Goal Heart Healthy Diet Short: Try out new recipe she was talking about Long: Continue to eat more heart healthy.      Comment Tykeria is doing well in rehab.  She is doing well with her diet.  She has really gotten away from salt.  She makes sure to buy low sodium and not add salt.  She is also doing well with adding in more fruits and vegetables.  Her favorite snack is olives and she has found some low salt olives.  She found a new recipe to try that was low salt with fruits and vegetables.  She is a little worried about the jalopneno.  She has also tried salamon for first time. Adin is doing well with her diet.  She has noticed that as she has been eating more fruit and vegetables, her skin is healthier and less dry.  She has gotten away from salt and trying new and dfferent seasonings as well.  She still loves her olives and now she can't find the low salt ones.  But she is going to keep trying to find them.  She never tried the new recipe but wants to try it still.  She also found a zucchini  lemon bread.  She is going to use almond  flour with it for more protein.      Expected Outcome Short: Try out new recipe she was talking about Long: Continue to eat more heart healthy. SHort: Try out the new recipes and seasonings Long: Continue to try for more healthy recipes.               Nutrition Goals Discharge (Final Nutrition Goals Re-Evaluation):  Nutrition Goals Re-Evaluation - 01/19/23 1445       Goals   Nutrition Goal Short: Try out new recipe she was talking about Long: Continue to eat more heart healthy.    Comment Mazelle is doing well with her diet.  She has noticed that as she has been eating more fruit and vegetables, her skin is healthier and less dry.  She has gotten away from salt and trying new and dfferent seasonings as well.  She still loves her olives and now she can't find the low salt ones.  But she is going to keep trying to find them.  She never tried the new recipe but wants to try it still.  She also found a zucchini  lemon bread.  She is going to use almond flour with it for more protein.    Expected Outcome SHort: Try out the new recipes and seasonings Long: Continue to try for more healthy recipes.             Psychosocial: Target Goals: Acknowledge presence or absence of significant depression and/or stress, maximize coping skills, provide positive support system. Participant is able to verbalize types and ability to use techniques and skills needed for reducing stress and depression.  Initial Review & Psychosocial Screening:  Initial Psych Review & Screening - 12/15/22 1314       Initial Review   Current issues with Current Stress Concerns;History of Depression;Current Depression;Current Anxiety/Panic;Current Psychotropic Meds;Current Sleep Concerns    Source of Stress Concerns Transportation;Financial    Comments Relies on insurance for transportation to appointments, sleeping better      Family Dynamics   Good Support System? No   daughter lives in another town, son lives in [place],  has a close friend but not a confidant   Strains Intra-family strains   kids live away from home, talks to daughter frequently   Concerns Inappropriate over/under dependence on family/friends;No support system      Barriers   Psychosocial barriers to participate in program Psychosocial barriers identified (see note);The patient should benefit from training in stress management and relaxation.      Screening Interventions   Interventions Encouraged to exercise;Provide feedback about the scores to participant;To provide support and resources with identified psychosocial needs    Expected Outcomes Short Term goal: Utilizing psychosocial counselor, staff and physician to assist with identification of specific Stressors or current issues interfering with healing process. Setting desired goal for each stressor or current issue identified.;Long Term Goal: Stressors or current issues are controlled or eliminated.;Short Term goal: Identification and review with participant of any Quality of Life or Depression concerns found by scoring the questionnaire.;Long Term goal: The participant improves quality of Life and PHQ9 Scores as seen by post scores and/or verbalization of changes             Quality of Life Scores:  Quality of Life - 12/17/22 1539       Quality of Life   Select Quality of Life      Quality of Life Scores   Health/Function Pre 19.6 %    Socioeconomic Pre 26.57 %    Psych/Spiritual Pre 30 %    Family Pre 24 %    GLOBAL Pre 23.82 %            Scores of 19 and below usually indicate a poorer quality of life in these areas.  A difference of  2-3 points is a clinically meaningful difference.  A difference of 2-3 points in the total score of the Quality of Life Index has been associated with significant improvement in overall quality of life, self-image, physical symptoms, and general health in studies assessing change in quality of life.  PHQ-9: Review Flowsheet        12/17/2022  Depression screen PHQ 2/9  Decreased Interest 1  Down, Depressed, Hopeless 1  PHQ - 2 Score 2  Altered sleeping 1  Tired, decreased energy 1  Change in appetite 0  Feeling bad or failure about yourself  0  Trouble concentrating 0  Moving slowly or fidgety/restless 0  Suicidal thoughts 0  PHQ-9 Score  4  Difficult doing work/chores Somewhat difficult    Details           Interpretation of Total Score  Total Score Depression Severity:  1-4 = Minimal depression, 5-9 = Mild depression, 10-14 = Moderate depression, 15-19 = Moderately severe depression, 20-27 = Severe depression   Psychosocial Evaluation and Intervention:  Psychosocial Evaluation - 12/15/22 1326       Psychosocial Evaluation & Interventions   Interventions Stress management education;Relaxation education;Encouraged to exercise with the program and follow exercise prescription    Comments Kayti is coming into cardiac rehab after a STEMI and stent to D1.  She is very motivated to get started with rehab and looks forward to strengthening her heart and body.  Prior to the last year, she used to walk a lot but the hills made her stop as they were starting to hurt her back and aggravate her fibromyalgia.  She really wants to work to get back to a walking routinue again, but not have it cause pain.  She is on cymbalta for her fibromyalgia. Right after her heart attack, she was very depressed and weeping daily, but now she is starting to feel better about herself and more positive.  She has quit smoking and has been successful thus far.  Her last cigarrette was the day of her heart attack and she has not had one since.  She was commended for her success.  She quit cold [place] as has taken up drawing and crafting in its place.  She also enjoys writing poetry.  Her health is her biggest stressor and her lack of a support system.  She has two kids.  Her daughter lives in a different part of the state and her son is in  [place]. She usually talks to her daughter most days but does not like to share her stress and concerns with her as she doesn't want to add to her daughter's stress.  She does have a close friend that lives near by but again does not want to burden her friend with her stressors/concerns.  She does rely on her insurance to cover transportation for medical appointments.  Scheduling transportation can be stressful for her as it usually turns a simple appointment into a full day ordeal.  She will be using transportation to get to rehab and hopes that it will go well for her.    Expected Outcomes short: Attend rehab to exercise to build strength Long: Exercise for mood boost and attend education to learn about heart health    Continue Psychosocial Services  Follow up required by staff             Psychosocial Re-Evaluation:  Psychosocial Re-Evaluation     Row Name 01/05/23 1458 01/19/23 1443           Psychosocial Re-Evaluation   Current issues with Current Psychotropic Meds;History of Depression;Current Stress Concerns Current Psychotropic Meds;History of Depression;Current Stress Concerns      Comments Lucelle is doing well in rehab.  She is feeling better menatlly and feels like she is on track to feeling more like herself. She is going to see foot doctor tomorrow for plantar facistis.  She is sleeping well too.  She enjoys coming to rehab and feels that it is helping her. Klohe is doing well in rehab. She is feeling good mentally.  She feels like herself again.  She has been eating better and has noticed that her skin in healthier too.  She is feeling  better.  She got her foot looked at and it is feeling a better with her new orthotics.  She still having some toe issues.  She is still sleeping pretty good overall.      Expected Outcomes Short: Continue to build back to self Long: COnitnue to exercise for mental boost Short: Continue to feel more like self Long: Continue to stay positive       Interventions Encouraged to attend Cardiac Rehabilitation for the exercise Encouraged to attend Cardiac Rehabilitation for the exercise      Continue Psychosocial Services  Follow up required by staff Follow up required by staff               Psychosocial Discharge (Final Psychosocial Re-Evaluation):  Psychosocial Re-Evaluation - 01/19/23 1443       Psychosocial Re-Evaluation   Current issues with Current Psychotropic Meds;History of Depression;Current Stress Concerns    Comments Leonne is doing well in rehab. She is feeling good mentally.  She feels like herself again.  She has been eating better and has noticed that her skin in healthier too.  She is feeling better.  She got her foot looked at and it is feeling a better with her new orthotics.  She still having some toe issues.  She is still sleeping pretty good overall.    Expected Outcomes Short: Continue to feel more like self Long: Continue to stay positive    Interventions Encouraged to attend Cardiac Rehabilitation for the exercise    Continue Psychosocial Services  Follow up required by staff             Vocational Rehabilitation: Provide vocational rehab assistance to qualifying candidates.   Vocational Rehab Evaluation & Intervention:  Vocational Rehab - 12/15/22 1319       Initial Vocational Rehab Evaluation & Intervention   Assessment shows need for Vocational Rehabilitation No   disabled            Education: Education Goals: Education classes will be provided on a weekly basis, covering required topics. Participant will state understanding/return demonstration of topics presented.  Learning Barriers/Preferences:  Learning Barriers/Preferences - 12/15/22 1308       Learning Barriers/Preferences   Learning Barriers Sight   glasses   Learning Preferences Written Material;Video;Pictoral             Education Topics: Hypertension, Hypertension Reduction -Define heart disease and high blood  pressure. Discus how high blood pressure affects the body and ways to reduce high blood pressure.   Exercise and Your Heart -Discuss why it is important to exercise, the FITT principles of exercise, normal and abnormal responses to exercise, and how to exercise safely. Flowsheet Row CARDIAC REHAB PHASE II EXERCISE from 02/04/2023 in [place] CARDIAC REHABILITATION  Date 02/04/23  Educator hb  Instruction Review Code 1- Verbalizes Understanding       Angina -Discuss definition of angina, causes of angina, treatment of angina, and how to decrease risk of having angina.   Cardiac Medications -Review what the following cardiac medications are used for, how they affect the body, and side effects that may occur when taking the medications.  Medications include Aspirin, Beta blockers, calcium channel blockers, ACE Inhibitors, angiotensin receptor blockers, diuretics, digoxin, and antihyperlipidemics. Flowsheet Row CARDIAC REHAB PHASE II EXERCISE from 02/04/2023 in [place] CARDIAC REHABILITATION  Date 01/14/23  Educator jh  Instruction Review Code 1- Verbalizes Understanding       Congestive Heart Failure -Discuss the definition of CHF, how to  live with CHF, the signs and symptoms of CHF, and how keep track of weight and sodium intake. Flowsheet Row CARDIAC REHAB PHASE II EXERCISE from 02/04/2023 in [place] CARDIAC REHABILITATION  Date 01/07/23  Educator [facility]  Instruction Review Code 1- Verbalizes Understanding       Heart Disease and Intimacy -Discus the effect sexual activity has on the heart, how changes occur during intimacy as we age, and safety during sexual activity.   Smoking Cessation / COPD -Discuss different methods to quit smoking, the health benefits of quitting smoking, and the definition of COPD.   Nutrition I: Fats -Discuss the types of cholesterol, what cholesterol does to the heart, and how cholesterol levels can be controlled. Flowsheet Row CARDIAC  REHAB PHASE II EXERCISE from 02/04/2023 in [place] CARDIAC REHABILITATION  Date 12/24/22  Educator [facility]  Instruction Review Code 1- Verbalizes Understanding       Nutrition II: Labels -Discuss the different components of food labels and how to read food label   Heart Parts/Heart Disease and PAD -Discuss the anatomy of the heart, the pathway of blood circulation through the heart, and these are affected by heart disease. Flowsheet Row CARDIAC REHAB PHASE II EXERCISE from 02/04/2023 in [place] CARDIAC REHABILITATION  Date 01/28/23  Educator jh  Instruction Review Code 1- Verbalizes Understanding       Stress I: Signs and Symptoms -Discuss the causes of stress, how stress may lead to anxiety and depression, and ways to limit stress. Flowsheet Row CARDIAC REHAB PHASE II EXERCISE from 02/04/2023 in [place] CARDIAC REHABILITATION  Date 12/31/22  Educator HB  Instruction Review Code 1- Verbalizes Understanding       Stress II: Relaxation -Discuss different types of relaxation techniques to limit stress.   Warning Signs of Stroke / TIA -Discuss definition of a stroke, what the signs and symptoms are of a stroke, and how to identify when someone is having stroke.   Knowledge Questionnaire Score:   Core Components/Risk Factors/Patient Goals at Admission:  Personal Goals and Risk Factors at Admission - 12/15/22 1317       Core Components/Risk Factors/Patient Goals on Admission    Weight Management Yes;Weight Maintenance    Intervention Weight Management: Develop a combined nutrition and exercise program designed to reach desired caloric intake, while maintaining appropriate intake of nutrient and fiber, sodium and fats, and appropriate energy expenditure required for the weight goal.;Weight Management: Provide education and appropriate resources to help participant work on and attain dietary goals.    Expected Outcomes Short Term: Continue to assess and modify  interventions until short term weight is achieved;Long Term: Adherence to nutrition and physical activity/exercise program aimed toward attainment of established weight goal;Weight Maintenance: Understanding of the daily nutrition guidelines, which includes 25-35% calories from fat, 7% or less cal from saturated fats, less than 200mg  cholesterol, less than 1.5gm of sodium, & 5 or more servings of fruits and vegetables daily    Tobacco Cessation Yes   quit 12/02/22   Number of packs per day 0    Intervention Offer self-teaching materials, assist with locating and accessing local/national Quit Smoking programs, and support quit date choice.    Expected Outcomes Long Term: Complete abstinence from all tobacco products for at least 12 months from quit date.;Short Term: Will quit all tobacco product use, adhering to prevention of relapse plan.    Hypertension Yes    Intervention Monitor prescription use compliance.;Provide education on lifestyle modifcations including regular physical activity/exercise, weight management,  moderate sodium restriction and increased consumption of fresh fruit, vegetables, and low fat dairy, alcohol moderation, and smoking cessation.    Expected Outcomes Short Term: Continued assessment and intervention until BP is < 140/43mm HG in hypertensive participants. < 130/84mm HG in hypertensive participants with diabetes, heart failure or chronic kidney disease.;Long Term: Maintenance of blood pressure at goal levels.    Lipids Yes    Intervention Provide education and support for participant on nutrition & aerobic/resistive exercise along with prescribed medications to achieve LDL 70mg , HDL >40mg .    Expected Outcomes Short Term: Participant states understanding of desired cholesterol values and is compliant with medications prescribed. Participant is following exercise prescription and nutrition guidelines.;Long Term: Cholesterol controlled with medications as prescribed, with  individualized exercise RX and with personalized nutrition plan. Value goals: LDL < 70mg , HDL > 40 mg.             Core Components/Risk Factors/Patient Goals Review:   Goals and Risk Factor Review     Row Name 01/05/23 1452 01/19/23 1448           Core Components/Risk Factors/Patient Goals Review   Personal Goals Review Weight Management/Obesity;Tobacco Cessation;Hypertension;Lipids Weight Management/Obesity;Hypertension;Lipids      Review Shelbea is doing well in rehab.  She missed Friday from not feeling well.  Today, her weight was up some from weekend but feels it was just from eating more over weekend. Her pressures have been running a little high for her in the 120s when she is usually in the 110s.  She is planning to call her doctor. about bruising and pressures. She says the blood thinners are making her bruise easily.  She is doing well with cessation.  She has not had any cravings recently.  She feels like the heart event was enough to scare her away from it. Taylen is doing well in rehab.  Her weight is trending down.  She is eating better and feeling better overall.  Her pressures are doing well.  She is doing well on her meds. She is still concerned about the brusing and wants to message her doctor about it.  She also gets SOB when she bends over or squats down.      Expected Outcomes Short: Conitnue to work on weight loss and talk to doctor Long: continue to monitor risk factors Short: Message doctor about brusing and breathing Long: Continue to monitor risk factors.               Core Components/Risk Factors/Patient Goals at Discharge (Final Review):   Goals and Risk Factor Review - 01/19/23 1448       Core Components/Risk Factors/Patient Goals Review   Personal Goals Review Weight Management/Obesity;Hypertension;Lipids    Review Jobyna is doing well in rehab.  Her weight is trending down.  She is eating better and feeling better overall.  Her pressures are doing well.   She is doing well on her meds. She is still concerned about the brusing and wants to message her doctor about it.  She also gets SOB when she bends over or squats down.    Expected Outcomes Short: Message doctor about brusing and breathing Long: Continue to monitor risk factors.             ITP Comments:  ITP Comments     Row Name 12/15/22 1325 12/17/22 1532 12/22/22 1449 01/14/23 0802 02/11/23 1019   ITP Comments Completed virtual orientation today.  EP evaluation is scheduled for Wednesday 12/17/22 at 1400 .  Documentation for diagnosis can be found in [facility] encounter 12/02/22. Patient arrived for 1st visit/orientation/education at 1400. Patient was referred to CR by Dr. Tonny Bollman due to STEMI/Stent placement. During orientation advised patient on arrival and appointment times what to wear, what to do before, during and after exercise. Reviewed attendance and class policy.  Pt is scheduled to return Cardiac Rehab on 12/22/22 at 1430. Pt was advised to come to class 15 minutes before class starts.  Discussed RPE/Dpysnea scales. Patient participated in warm up stretches. Patient was able to complete 6 minute walk test.  Telemetry:NSR. Patient was measured for the equipment. Discussed equipment safety with patient. Took patient pre-anthropometric measurements. Patient finished visit at 1500. First full day of exercise!  Patient was oriented to gym and equipment including functions, settings, policies, and procedures.  Patient's individual exercise prescription and treatment plan were reviewed.  All starting workloads were established based on the results of the 6 minute walk test done at initial orientation visit.  The plan for exercise progression was also introduced and progression will be customized based on patient's performance and goals. 30 day review completed. ITP sent to Dr. Dina Rich, Medical Director of Cardiac Rehab. Continue with ITP unless changes are made by physician. 30 day review  completed. ITP sent to Dr. Dina Rich, Medical Director of Cardiac Rehab. Continue with ITP unless changes are made by physician.            Comments: 30 day review

## 2023-02-11 NOTE — Progress Notes (Signed)
Daily Session Note  Patient Details  Name: Sarah Vargas MRN: [id number] Date of Birth: 1960/12/02 Referring Provider:   Flowsheet Row CARDIAC REHAB PHASE II ORIENTATION from 12/17/2022 in [facility] CARDIAC REHABILITATION  Referring Provider Tonny Bollman MD       Encounter Date: 02/11/2023  Check In:  Session Check In - 02/11/23 1415       Check-In   Supervising physician immediately available to respond to emergencies See telemetry face sheet for immediately available MD    Location [facility]    Staff Present Ross Ludwig, BS, Exercise Physiologist;Jessica Juanetta Gosling, MA, RCEP, CCRP, CCET    Virtual Visit No    Medication changes reported     No    Fall or balance concerns reported    No    Tobacco Cessation No Change    Warm-up and Cool-down Performed on first and last piece of equipment    Resistance Training Performed Yes    VAD Patient? No    PAD/SET Patient? No      Pain Assessment   Currently in Pain? No/denies    Pain Score 0-No pain    Multiple Pain Sites No             Capillary Blood Glucose: No results found for this or any previous visit (from the past 24 hour(s)).    Social History   Tobacco Use  Smoking Status Former   Current packs/day: 0.00   Average packs/day: 0.5 packs/day for 30.0 years (15.0 ttl pk-yrs)   Types: Cigarettes   Quit date: 12/02/2022   Years since quitting: 0.1  Smokeless Tobacco Never  Tobacco Comments   Quit 12/02/22    Goals Met:  Independence with exercise equipment Exercise tolerated well No report of concerns or symptoms today Strength training completed today  Goals Unmet:  Not Applicable  Comments: Pt able to follow exercise prescription today without complaint.  Will continue to monitor for progression.

## 2023-02-13 ENCOUNTER — Encounter (HOSPITAL_COMMUNITY)
Admission: RE | Admit: 2023-02-13 | Discharge: 2023-02-13 | Disposition: A | Discharge status: Home / Self Care | Payer: Medicaid Other | Source: Ambulatory Visit | Attending: Cardiovascular Disease | Admitting: Cardiovascular Disease

## 2023-02-13 DIAGNOSIS — I2129 ST elevation (STEMI) myocardial infarction involving other sites: Secondary | ICD-10-CM

## 2023-02-13 DIAGNOSIS — Z955 Presence of coronary angioplasty implant and graft: Secondary | ICD-10-CM | POA: Diagnosis not present

## 2023-02-13 NOTE — Progress Notes (Signed)
Daily Session Note  Patient Details  Name: Sarah Vargas MRN: [id number] Date of Birth: [birth year] Referring Provider:   Flowsheet Row CARDIAC REHAB PHASE II ORIENTATION from 12/17/2022 in [facility] CARDIAC REHABILITATION  Referring Provider Tonny Bollman MD       Encounter Date: 02/13/2023  Check In:  Session Check In - 02/13/23 1415       Check-In   Supervising physician immediately available to respond to emergencies See telemetry face sheet for immediately available ER MD    Location [facility]    Staff Present Rodena Medin, RN, BSN;Heather Fredric Mare, BS, Exercise Physiologist    Virtual Visit No    Medication changes reported     No    Fall or balance concerns reported    No    Warm-up and Cool-down Performed on first and last piece of equipment    Resistance Training Performed Yes    VAD Patient? No    PAD/SET Patient? No      Pain Assessment   Currently in Pain? No/denies    Pain Score 0-No pain    Multiple Pain Sites No             Capillary Blood Glucose: No results found for this or any previous visit (from the past 24 hour(s)).    Social History   Tobacco Use  Smoking Status Former   Current packs/day: 0.00   Average packs/day: 0.5 packs/day for 30.0 years (15.0 ttl pk-yrs)   Types: Cigarettes   Quit date: 12/02/2022   Years since quitting: 0.2  Smokeless Tobacco Never  Tobacco Comments   Quit 12/02/22    Goals Met:  Independence with exercise equipment Exercise tolerated well No report of concerns or symptoms today Strength training completed today  Goals Unmet:  Not Applicable  Comments: Pt able to follow exercise prescription today without complaint.  Will continue to monitor for progression.

## 2023-02-16 ENCOUNTER — Encounter (HOSPITAL_COMMUNITY): Payer: Medicaid Other

## 2023-02-18 ENCOUNTER — Encounter (HOSPITAL_COMMUNITY)
Admission: RE | Admit: 2023-02-18 | Discharge: 2023-02-18 | Disposition: A | Discharge status: Home / Self Care | Payer: Medicaid Other | Source: Ambulatory Visit | Attending: Cardiovascular Disease | Admitting: Cardiovascular Disease

## 2023-02-18 DIAGNOSIS — I2129 ST elevation (STEMI) myocardial infarction involving other sites: Secondary | ICD-10-CM

## 2023-02-18 DIAGNOSIS — Z955 Presence of coronary angioplasty implant and graft: Secondary | ICD-10-CM | POA: Diagnosis not present

## 2023-02-18 NOTE — Progress Notes (Signed)
Daily Session Note  Patient Details  Name: Sarah Vargas MRN: [id number] Date of Birth: 07-14-1960 Referring Provider:   Flowsheet Row CARDIAC REHAB PHASE II ORIENTATION from 12/17/2022 in [facility] CARDIAC REHABILITATION  Referring Provider Tonny Bollman MD       Encounter Date: 02/18/2023  Check In:  Session Check In - 02/18/23 1400       Check-In   Supervising physician immediately available to respond to emergencies See telemetry face sheet for immediately available MD    Location [facility]    Staff Present Ross Ludwig, BS, Exercise Physiologist;Other    Virtual Visit No    Medication changes reported     No    Fall or balance concerns reported    No    Tobacco Cessation No Change    Warm-up and Cool-down Performed on first and last piece of equipment      Pain Assessment   Currently in Pain? No/denies    Pain Score 0-No pain    Multiple Pain Sites No             Capillary Blood Glucose: No results found for this or any previous visit (from the past 24 hour(s)).    Social History   Tobacco Use  Smoking Status Former   Current packs/day: 0.00   Average packs/day: 0.5 packs/day for 30.0 years (15.0 ttl pk-yrs)   Types: Cigarettes   Quit date: 12/02/2022   Years since quitting: 0.2  Smokeless Tobacco Never  Tobacco Comments   Quit 12/02/22    Goals Met:  Independence with exercise equipment Exercise tolerated well No report of concerns or symptoms today Strength training completed today  Goals Unmet:  Not Applicable  Comments: Pt able to follow exercise prescription today without complaint.  Will continue to monitor for progression.

## 2023-02-20 ENCOUNTER — Encounter (HOSPITAL_COMMUNITY)
Admission: RE | Admit: 2023-02-20 | Discharge: 2023-02-20 | Disposition: A | Discharge status: Home / Self Care | Payer: Medicaid Other | Source: Ambulatory Visit | Attending: Cardiovascular Disease | Admitting: Cardiovascular Disease

## 2023-02-20 DIAGNOSIS — Z955 Presence of coronary angioplasty implant and graft: Secondary | ICD-10-CM

## 2023-02-20 DIAGNOSIS — I2129 ST elevation (STEMI) myocardial infarction involving other sites: Secondary | ICD-10-CM

## 2023-02-20 NOTE — Progress Notes (Signed)
Daily Session Note  Patient Details  Name: Sarah Vargas MRN: [id number] Date of Birth: 01/08/1961 Referring Provider:   Flowsheet Row CARDIAC REHAB PHASE II ORIENTATION from 12/17/2022 in [facility] CARDIAC REHABILITATION  Referring Provider Tonny Bollman MD       Encounter Date: 02/20/2023  Check In:  Session Check In - 02/20/23 1415       Check-In   Supervising physician immediately available to respond to emergencies See telemetry face sheet for immediately available ER MD    Location [facility]    Staff Present Rodena Medin, RN, BSN;Heather Fredric Mare, BS, Exercise Physiologist;Danny Gala Romney, RN, BSN    Virtual Visit No    Medication changes reported     No    Fall or balance concerns reported    No    Warm-up and Cool-down Performed on first and last piece of equipment    Resistance Training Performed Yes    VAD Patient? No    PAD/SET Patient? No      Pain Assessment   Currently in Pain? No/denies    Pain Score 0-No pain    Multiple Pain Sites No             Capillary Blood Glucose: No results found for this or any previous visit (from the past 24 hours).    Social History   Tobacco Use  Smoking Status Former   Current packs/day: 0.00   Average packs/day: 0.5 packs/day for 30.0 years (15.0 ttl pk-yrs)   Types: Cigarettes   Quit date: 12/02/2022   Years since quitting: 0.2  Smokeless Tobacco Never  Tobacco Comments   Quit 12/02/22    Goals Met:  Independence with exercise equipment Exercise tolerated well No report of concerns or symptoms today Strength training completed today  Goals Unmet:  Not Applicable  Comments: Pt able to follow exercise prescription today without complaint.  Will continue to monitor for progression.

## 2023-02-23 ENCOUNTER — Encounter (HOSPITAL_COMMUNITY)
Admission: RE | Admit: 2023-02-23 | Discharge: 2023-02-23 | Disposition: A | Discharge status: Home / Self Care | Payer: Medicaid Other | Source: Ambulatory Visit | Attending: Cardiovascular Disease

## 2023-02-23 DIAGNOSIS — Z955 Presence of coronary angioplasty implant and graft: Secondary | ICD-10-CM

## 2023-02-23 DIAGNOSIS — I2129 ST elevation (STEMI) myocardial infarction involving other sites: Secondary | ICD-10-CM

## 2023-02-23 NOTE — Progress Notes (Signed)
Daily Session Note  Patient Details  Name: Sarah Vargas MRN: [id number] Date of Birth: 08/13/1960 Referring Provider:   Flowsheet Row CARDIAC REHAB PHASE II ORIENTATION from 12/17/2022 in [facility] CARDIAC REHABILITATION  Referring Provider Tonny Bollman MD       Encounter Date: 02/23/2023  Check In:  Session Check In - 02/23/23 1430       Check-In   Supervising physician immediately available to respond to emergencies See telemetry face sheet for immediately available MD    Location [facility]    Staff Present Ross Ludwig, BS, Exercise Physiologist;Josslyn Ciolek, RN;Jessica Harris, MA, RCEP, CCRP, CCET;Other    Virtual Visit No    Medication changes reported     No    Fall or balance concerns reported    No    Warm-up and Cool-down Performed on first and last piece of equipment    Resistance Training Performed Yes    VAD Patient? No    PAD/SET Patient? No      Pain Assessment   Currently in Pain? No/denies    Pain Score 0-No pain    Multiple Pain Sites No             Capillary Blood Glucose: No results found for this or any previous visit (from the past 24 hours).    Social History   Tobacco Use  Smoking Status Former   Current packs/day: 0.00   Average packs/day: 0.5 packs/day for 30.0 years (15.0 ttl pk-yrs)   Types: Cigarettes   Quit date: 12/02/2022   Years since quitting: 0.2  Smokeless Tobacco Never  Tobacco Comments   Quit 12/02/22    Goals Met:  Independence with exercise equipment Exercise tolerated well No report of concerns or symptoms today Strength training completed today  Goals Unmet:  Not Applicable  Comments: Pt able to follow exercise prescription today without complaint.  Will continue to monitor for progression.

## 2023-02-25 ENCOUNTER — Encounter (HOSPITAL_COMMUNITY): Payer: Medicaid Other

## 2023-02-26 ENCOUNTER — Telehealth: Payer: Self-pay | Admitting: General Practice

## 2023-02-26 MED ORDER — ROSUVASTATIN CALCIUM 40 MG PO TABS: 40.0000 mg | ORAL_TABLET | Freq: Every day | ORAL | 2 refills | Status: DC

## 2023-02-26 MED ORDER — METOPROLOL SUCCINATE ER 25 MG PO TB24: 12.5000 mg | ORAL_TABLET | Freq: Every day | ORAL | 2 refills | Status: DC

## 2023-02-26 NOTE — Telephone Encounter (Signed)
 *  STAT* If patient is at the pharmacy, call can be transferred to refill team.   1. Which medications need to be refilled? (please list name of each medication and dose if known)   rosuvastatin (CRESTOR) 40 MG tablet  metoprolol succinate (TOPROL-XL) 25 MG 24 hr tablet   2. Which pharmacy/location (including street and city if local pharmacy) is medication to be sent to?  [facility] [place], [place] - [address]    3. Do they need a 30 day or 90 day supply? 90 days

## 2023-02-26 NOTE — Telephone Encounter (Signed)
Pt's medications were sent to pt's pharmacy as requested. Confirmation received.  

## 2023-02-27 ENCOUNTER — Encounter (HOSPITAL_COMMUNITY)
Admission: RE | Admit: 2023-02-27 | Discharge: 2023-02-27 | Disposition: A | Discharge status: Home / Self Care | Payer: Medicaid Other | Source: Ambulatory Visit | Attending: Cardiovascular Disease | Admitting: Cardiovascular Disease

## 2023-02-27 DIAGNOSIS — I2129 ST elevation (STEMI) myocardial infarction involving other sites: Secondary | ICD-10-CM

## 2023-02-27 DIAGNOSIS — Z955 Presence of coronary angioplasty implant and graft: Secondary | ICD-10-CM

## 2023-02-27 NOTE — Progress Notes (Signed)
Daily Session Note  Patient Details  Name: Sarah Vargas MRN: [id number] Date of Birth: 1960-08-01 Referring Provider:   Flowsheet Row CARDIAC REHAB PHASE II ORIENTATION from 12/17/2022 in [facility] CARDIAC REHABILITATION  Referring Provider Tonny Bollman MD       Encounter Date: 02/27/2023  Check In:  Session Check In - 02/27/23 1415       Check-In   Supervising physician immediately available to respond to emergencies See telemetry face sheet for immediately available ER MD    Location [facility]    Staff Present Rodena Medin, RN, BSN;Phyllis Billingsley, RN    Virtual Visit No    Medication changes reported     No    Fall or balance concerns reported    No    Warm-up and Cool-down Performed on first and last piece of equipment    Resistance Training Performed Yes    VAD Patient? No    PAD/SET Patient? No      Pain Assessment   Currently in Pain? No/denies    Pain Score 0-No pain    Multiple Pain Sites No             Capillary Blood Glucose: No results found for this or any previous visit (from the past 24 hours).    Social History   Tobacco Use  Smoking Status Former   Current packs/day: 0.00   Average packs/day: 0.5 packs/day for 30.0 years (15.0 ttl pk-yrs)   Types: Cigarettes   Quit date: 12/02/2022   Years since quitting: 0.2  Smokeless Tobacco Never  Tobacco Comments   Quit 12/02/22    Goals Met:  Independence with exercise equipment Exercise tolerated well No report of concerns or symptoms today Strength training completed today  Goals Unmet:  Not Applicable  Comments: Pt able to follow exercise prescription today without complaint.  Will continue to monitor for progression.

## 2023-03-02 ENCOUNTER — Encounter (HOSPITAL_COMMUNITY): Payer: Medicaid Other

## 2023-03-06 ENCOUNTER — Encounter (HOSPITAL_COMMUNITY): Payer: Medicaid Other

## 2023-03-07 ENCOUNTER — Other Ambulatory Visit: Payer: Self-pay | Admitting: Physician Assistant

## 2023-03-07 MED ORDER — PANTOPRAZOLE SODIUM 40 MG PO TBEC: 40.0000 mg | DELAYED_RELEASE_TABLET | Freq: Every day | ORAL | 1 refills | Status: DC

## 2023-03-07 MED ORDER — TICAGRELOR 90 MG PO TABS: 90.0000 mg | ORAL_TABLET | Freq: Two times a day (BID) | ORAL | 3 refills | Status: DC

## 2023-03-09 ENCOUNTER — Encounter (HOSPITAL_COMMUNITY): Payer: Medicaid Other

## 2023-03-10 ENCOUNTER — Encounter (HOSPITAL_COMMUNITY): Payer: Self-pay | Admitting: *Deleted

## 2023-03-10 DIAGNOSIS — I2129 ST elevation (STEMI) myocardial infarction involving other sites: Secondary | ICD-10-CM

## 2023-03-10 DIAGNOSIS — Z955 Presence of coronary angioplasty implant and graft: Secondary | ICD-10-CM

## 2023-03-10 NOTE — Progress Notes (Signed)
 Cardiac Individual Treatment Plan  Patient Details  Name: Sarah Vargas MRN: [id number] Date of Birth: 1960/06/26 Referring Provider:   Flowsheet Row CARDIAC REHAB PHASE II ORIENTATION from 12/17/2022 in [facility] CARDIAC REHABILITATION  Referring Provider Wonda Sharper MD       Initial Encounter Date:  Flowsheet Row CARDIAC REHAB PHASE II ORIENTATION from 12/17/2022 in [facility] CARDIAC REHABILITATION  Date 12/17/22       Visit Diagnosis: ST elevation myocardial infarction (STEMI) involving other coronary artery [facility])  Status post coronary artery stent placement  Patient's Home Medications on Admission:  Current Outpatient Medications:    alendronate (FOSAMAX) 70 MG tablet, Take 70 mg by mouth once a week. Take on empty stomach with full glass of water, sit upright for 1 hour after., Disp: , Rfl:    aspirin  EC 81 MG tablet, Take 1 tablet (81 mg total) by mouth daily. Swallow whole., Disp: 120 tablet, Rfl: 12   baclofen  (LIORESAL ) 10 MG tablet, Take 10 mg by mouth 3 (three) times daily., Disp: , Rfl:    Calcium  Carb-Cholecalciferol (CALCIUM  600 + D PO), Take 1 tablet by mouth in the morning and at bedtime., Disp: , Rfl:    DULoxetine  (CYMBALTA ) 30 MG capsule, Take 30 mg by mouth See admin instructions. Take with Duloxetine  60mg  to equal 90mg  total., Disp: , Rfl:    DULoxetine  (CYMBALTA ) 60 MG capsule, Take 60 mg by mouth See admin instructions. Take with Duloxetine  30mg  to equal 90mg  total., Disp: , Rfl:    loratadine  (CLARITIN ) 10 MG tablet, Take 10 mg by mouth daily., Disp: , Rfl:    metoprolol  succinate (TOPROL -XL) 25 MG 24 hr tablet, Take 0.5 tablets (12.5 mg total) by mouth daily., Disp: 45 tablet, Rfl: 2   Multiple Vitamin (MULTIVITAMIN) tablet, Take 1 tablet by mouth daily., Disp: , Rfl:    nitroGLYCERIN  (NITROSTAT ) 0.4 MG SL tablet, Place 1 tablet (0.4 mg total) under the tongue every 5 (five) minutes as needed for chest pain., Disp: 25 tablet, Rfl: 3   oxybutynin   (DITROPAN ) 5 MG tablet, Take 5 mg by mouth daily. (Patient not taking: Reported on 12/17/2022), Disp: , Rfl:    pantoprazole  (PROTONIX ) 40 MG tablet, Take 1 tablet (40 mg total) by mouth daily., Disp: 90 tablet, Rfl: 1   rosuvastatin  (CRESTOR ) 40 MG tablet, Take 1 tablet (40 mg total) by mouth daily., Disp: 90 tablet, Rfl: 2   ticagrelor  (BRILINTA ) 90 MG TABS tablet, Take 1 tablet (90 mg total) by mouth 2 (two) times daily., Disp: 180 tablet, Rfl: 3   topiramate  (TOPAMAX ) 50 MG tablet, Take 50 mg by mouth 2 (two) times daily., Disp: , Rfl:    traZODone  (DESYREL ) 150 MG tablet, Take 150 mg by mouth at bedtime., Disp: , Rfl:    triamcinolone ointment (KENALOG) 0.5 %, Apply 1 Application topically 2 (two) times daily., Disp: , Rfl:   Past Medical History: Past Medical History:  Diagnosis Date   Back pain    Chest pain    12/2010.   Fibromyalgia    Generalized headaches    GERD (gastroesophageal reflux disease) 12/10/2010   HTN (hypertension) 12/10/2010   Kidney stones    s/p ureteral stents-removed in past.   LBP (low back pain) 12/10/2010   Osteoarthritis    Positive H. pylori test 12/10/2010   Treated 2011.   STEMI (ST elevation myocardial infarction) (HCC) 12/02/2022   Tobacco abuse 12/10/2010    Tobacco Use: Social History   Tobacco Use  Smoking Status Former   Current packs/day: 0.00   Average packs/day: 0.5 packs/day for 30.0 years (15.0 ttl pk-yrs)   Types: Cigarettes   Quit date: 12/02/2022   Years since quitting: 0.2  Smokeless Tobacco Never  Tobacco Comments   Quit 12/02/22    Labs: Review Flowsheet       Latest Ref Rng & Units 12/11/2010 12/02/2022  Labs for ITP Cardiac and Pulmonary Rehab  Cholestrol 0 - 200 mg/dL 737  845   LDL (calc) 0 - 99 mg/dL 840  80   HDL-C >59 mg/dL 37  54   Trlycerides <849 mg/dL 668  898   Hemoglobin J8r 4.8 - 5.6 % - 5.5   TCO2 22 - 32 mmol/L - 20     Capillary Blood Glucose: No results found for: GLUCAP   Exercise Target  Goals: Exercise Program Goal: Individual exercise prescription set using results from initial 6 min walk test and THRR while considering  patient's activity barriers and safety.   Exercise Prescription Goal: Starting with aerobic activity 30 plus minutes a day, 3 days per week for initial exercise prescription. Provide home exercise prescription and guidelines that participant acknowledges understanding prior to discharge.  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 HR 80 bpm     Max Ex. BP 120/70     2 Minute Post BP 108/70              Oxygen Initial Assessment:   Oxygen Re-Evaluation:   Oxygen Discharge (Final Oxygen Re-Evaluation):   Initial Exercise Prescription:  Initial Exercise Prescription - 12/17/22 1500       Date of Initial Exercise RX and Referring Provider   Date 12/17/22    Referring Provider Wonda Sharper MD      Oxygen   Maintain Oxygen Saturation 88% or higher      Prescription Details   Frequency (times per week) 3    Duration Progress to 30 minutes of continuous aerobic without signs/symptoms of physical distress      Intensity   THRR 40-80% of Max Heartrate 109-142    Ratings of Perceived  Exertion 11-13    Perceived Dyspnea 0-4      Resistance Training   Training Prescription Yes    Weight 4 lbs / bluw band    Reps 10-15  Perform Capillary Blood Glucose checks as needed.  Exercise Prescription Changes:   Exercise Prescription Changes     Row Name 12/17/22 1500 01/05/23 1500 01/19/23 1200 01/28/23 1500 02/09/23 1500     Response to Exercise   Blood Pressure (Admit) 100/70 -- 128/60 120/80 122/60   Blood Pressure (Exercise) 120/70 -- -- -- --   Blood Pressure (Exit) 108/70 -- 110/60 118/70 120/62   Heart Rate (Admit) 76 bpm -- 78 bpm 76 bpm 70 bpm   Heart Rate (Exercise) 80 bpm -- 109 bpm 110 bpm 110 bpm   Heart Rate (Exit) 70 bpm -- 87 bpm 102 bpm 75 bpm   Oxygen Saturation (Admit) 97 % -- -- -- --   Oxygen Saturation (Exercise) 95 % -- -- -- --   Oxygen Saturation (Exit) 95 % -- -- -- --   Rating of Perceived Exertion (Exercise) 13 -- 13 13 13    Perceived Dyspnea (Exercise) 1 -- -- -- --   Duration -- -- Continue with 30 min of aerobic exercise without signs/symptoms of physical distress. Continue with 30 min of aerobic exercise without signs/symptoms of physical distress. Continue with 30 min of aerobic exercise without signs/symptoms of physical distress.   Intensity -- -- THRR unchanged THRR unchanged THRR unchanged     Progression   Progression -- -- Continue to progress workloads to maintain intensity without signs/symptoms of physical distress. Continue to progress workloads to maintain intensity without signs/symptoms of physical distress. Continue to progress workloads to maintain intensity without signs/symptoms of physical distress.     Resistance Training   Training Prescription -- -- Yes Yes Yes   Weight -- -- 4 lbs 4 lbs / red band 3 lbs   Reps -- -- 10-15 10-15 10-15     Treadmill   MPH 1.4 -- 1.8 1.5 1.9   Grade 0 -- 1 1 1    Minutes 15 -- 15 15 15    METs 2.4 -- 2.63 2.35 2.72     NuStep   Level 2 -- 2 3 3    SPM 50 -- 74 83  80   Minutes 15 -- 15 15 15    METs 1.8 -- 2.2 2.3 2     Home Exercise Plan   Plans to continue exercise at -- Home (comment)  walking, staff videos Home (comment) Home (comment) Home (comment)   Frequency -- Add 2 additional days to program exercise sessions. Add 2 additional days to program exercise sessions. Add 2 additional days to program exercise sessions. Add 2 additional days to program exercise sessions.   Initial Home Exercises Provided -- 01/05/23 -- -- --     Oxygen   Maintain Oxygen Saturation -- -- -- 88% or higher 88% or higher            Exercise Comments:   Exercise Goals and Review:   Exercise Goals     Row Name 12/15/22 1325 12/17/22 1525           Exercise Goals   Increase Physical Activity Yes Yes      Intervention Provide advice, education, support and counseling about physical activity/exercise needs.;Develop an individualized exercise prescription for aerobic and resistive training based on initial evaluation findings, risk stratification, comorbidities and participant's personal goals. --      Expected Outcomes Short Term: Attend rehab on a regular basis to increase amount of physical activity.;Long Term: Add in home exercise to make exercise part of routine and to increase amount of physical activity.;Long Term: Exercising  regularly at least 3-5 days a week. --      Increase Strength and Stamina Yes --      Intervention Provide advice, education, support and counseling about physical activity/exercise needs.;Develop an individualized exercise prescription for aerobic and resistive training based on initial evaluation findings, risk stratification, comorbidities and participant's personal goals. --      Expected Outcomes Short Term: Increase workloads from initial exercise prescription for resistance, speed, and METs.;Short Term: Perform resistance training exercises routinely during rehab and add in resistance training at home;Long Term: Improve  cardiorespiratory fitness, muscular endurance and strength as measured by increased METs and functional capacity ( ) --      Able to understand and use rate of perceived exertion (RPE) scale Yes --      Intervention Provide education and explanation on how to use RPE scale --      Expected Outcomes Short Term: Able to use RPE daily in rehab to express subjective intensity level;Long Term:  Able to use RPE to guide intensity level when exercising independently --      Able to understand and use Dyspnea scale Yes --      Intervention Provide education and explanation on how to use Dyspnea scale --      Expected Outcomes Short Term: Able to use Dyspnea scale daily in rehab to express subjective sense of shortness of breath during exertion;Long Term: Able to use Dyspnea scale to guide intensity level when exercising independently --      Knowledge and understanding of Target Heart Rate Range (THRR) Yes --      Intervention Provide education and explanation of THRR including how the numbers were predicted and where they are located for reference --      Expected Outcomes Short Term: Able to state/look up THRR;Long Term: Able to use THRR to govern intensity when exercising independently;Short Term: Able to use daily as guideline for intensity in rehab --      Able to check pulse independently Yes --      Intervention Provide education and demonstration on how to check pulse in carotid and radial arteries.;Review the importance of being able to check your own pulse for safety during independent exercise --      Expected Outcomes Short Term: Able to explain why pulse checking is important during independent exercise;Long Term: Able to check pulse independently and accurately --      Understanding of Exercise Prescription Yes --      Intervention Provide education, explanation, and written materials on patient's individual exercise prescription --      Expected Outcomes Short Term: Able to explain program  exercise prescription;Long Term: Able to explain home exercise prescription to exercise independently --               Exercise Goals Re-Evaluation :  Exercise Goals Re-Evaluation     Row Name 01/05/23 1500 01/19/23 1441 01/21/23 0825 02/23/23 1436       Exercise Goal Re-Evaluation   Exercise Goals Review Increase Physical Activity;Increase Strength and Stamina;Understanding of Exercise Prescription Increase Physical Activity;Increase Strength and Stamina;Understanding of Exercise Prescription Increase Physical Activity;Able to understand and use rate of perceived exertion (RPE) scale;Understanding of Exercise Prescription Increase Physical Activity;Increase Strength and Stamina;Understanding of Exercise Prescription    Comments Everlee is doing well in rehab.  She is feeling good and already walking at home.  She has noted that it can be tough with hills.  She will stay on the sidewalk and just sticking between  the hills. Reviewed home exercise with pt today.  Pt plans to walk and use staff videos at home for exercise.  Reviewed THR, pulse, RPE, sign and symptoms, pulse oximetery and when to call 911 or MD.  Also discussed weather considerations and indoor options.  Pt voiced understanding. Zayley is doing well in rehab.  She is feeling good.  She is walking on her off days.  She goes up and down hills in the neighborhood across this street.  She feels like her stamina is starting to get better.  She has not gotten to weights yet, but been unpacking from her remodel. Dorotea is doing well in rehab. She has increased her level on the stepper to level 2. Will continue to monitor and progress as able. Shophia is doing great at rehab. She has increased her speed on the treadmill and grade 2.0/1.0. She has noticed that she is not getting as tired when doing cleaning at home. Her endurance has increased    Expected Outcomes Short: Start to add in more walking at home Long: Continue to improve stamina  Short: Continue to walk on off days and add in weights Long: Conitnue to add in more walking at home. Short: increase treadmill speed  long term: continue to attend rehab Short: add in home exrcise long: continue to eericse at rehab and home              Discharge Exercise Prescription (Final Exercise Prescription Changes):  Exercise Prescription Changes - 02/09/23 1500       Response to Exercise   Blood Pressure (Admit) 122/60    Blood Pressure (Exit) 120/62    Heart Rate (Admit) 70 bpm    Heart Rate (Exercise) 110 bpm    Heart Rate (Exit) 75 bpm    Rating of Perceived Exertion (Exercise) 13    Duration Continue with 30 min of aerobic exercise without signs/symptoms of physical distress.    Intensity THRR unchanged      Progression   Progression Continue to progress workloads to maintain intensity without signs/symptoms of physical distress.      Resistance Training   Training Prescription Yes    Weight 3 lbs    Reps 10-15      Treadmill   MPH 1.9    Grade 1    Minutes 15    METs 2.72      NuStep   Level 3    SPM 80    Minutes 15    METs 2      Home Exercise Plan   Plans to continue exercise at Home (comment)    Frequency Add 2 additional days to program exercise sessions.      Oxygen   Maintain Oxygen Saturation 88% or higher             Nutrition:  Target Goals: Understanding of nutrition guidelines, daily intake of sodium 1500mg , cholesterol 200mg , calories 30% from fat and 7% or less from saturated fats, daily to have 5 or more servings of fruits and vegetables.  Biometrics:  Pre Biometrics - 12/17/22 1525       Pre Biometrics   Height 5' 4 (1.626 m)    Weight 138 lb 14.2 oz (63 kg)    Waist Circumference 36 inches    Hip Circumference 40 inches    Waist to Hip Ratio 0.9 %    BMI (Calculated) 23.83    Grip Strength 14.2 kg    Single Leg Stand 5.75 seconds  Nutrition Therapy Plan and Nutrition Goals:  Nutrition  Therapy & Goals - 12/15/22 1326       Nutrition Therapy   Diet DASH Diet: Has purchased book to learn more about it.      Intervention Plan   Intervention Nutrition handout(s) given to patient.    Expected Outcomes Short Term Goal: Understand basic principles of dietary content, such as calories, fat, sodium, cholesterol and nutrients.;Long Term Goal: Adherence to prescribed nutrition plan.             Nutrition Assessments:  MEDIFICTS Score Key: >=70 Need to make dietary changes  40-70 Heart Healthy Diet <= 40 Therapeutic Level Cholesterol Diet  Flowsheet Row CARDIAC REHAB PHASE II ORIENTATION from 12/17/2022 in [facility] CARDIAC REHABILITATION  Picture Your Plate Total Score on Admission 28      Picture Your Plate Scores: <59 Unhealthy dietary pattern with much room for improvement. 41-50 Dietary pattern unlikely to meet recommendations for good health and room for improvement. 51-60 More healthful dietary pattern, with some room for improvement.  >60 Healthy dietary pattern, although there may be some specific behaviors that could be improved.    Nutrition Goals Re-Evaluation:  Nutrition Goals Re-Evaluation     Row Name 01/05/23 1455 01/19/23 1445 02/23/23 1445         Goals   Nutrition Goal Heart Healthy Diet Short: Try out new recipe she was talking about Long: Continue to eat more heart healthy. healthy eating     Comment Lile is doing well in rehab.  She is doing well with her diet.  She has really gotten away from salt.  She makes sure to buy low sodium and not add salt.  She is also doing well with adding in more fruits and vegetables.  Her favorite snack is olives and she has found some low salt olives.  She found a new recipe to try that was low salt with fruits and vegetables.  She is a little worried about the jalopneno.  She has also tried salamon for first time. Arelie is doing well with her diet.  She has noticed that as she has been eating more fruit and  vegetables, her skin is healthier and less dry.  She has gotten away from salt and trying new and dfferent seasonings as well.  She still loves her olives and now she can't find the low salt ones.  But she is going to keep trying to find them.  She never tried the new recipe but wants to try it still.  She also found a zucchini  lemon bread.  She is going to use almond flour with it for more protein. Tristan continues to do well with her diet. She has been finding new recipes to use and has increased her herps at home and getting spices that does not have all the salt. She has enjoyed trying new recipes and cookin.     Expected Outcome Short: Try out new recipe she was talking about Long: Continue to eat more heart healthy. SHort: Try out the new recipes and seasonings Long: Continue to try for more healthy recipes. Short: continue to ry new recipes and find healthy ones you enjoy  long: continue to cook healthy options              Nutrition Goals Discharge (Final Nutrition Goals Re-Evaluation):  Nutrition Goals Re-Evaluation - 02/23/23 1445       Goals   Nutrition Goal healthy eating    Comment  Aniza continues to do well with her diet. She has been finding new recipes to use and has increased her herps at home and getting spices that does not have all the salt. She has enjoyed trying new recipes and cookin.    Expected Outcome Short: continue to ry new recipes and find healthy ones you enjoy  long: continue to cook healthy options             Psychosocial: Target Goals: Acknowledge presence or absence of significant depression and/or stress, maximize coping skills, provide positive support system. Participant is able to verbalize types and ability to use techniques and skills needed for reducing stress and depression.  Initial Review & Psychosocial Screening:  Initial Psych Review & Screening - 12/15/22 1314       Initial Review   Current issues with Current Stress Concerns;History  of Depression;Current Depression;Current Anxiety/Panic;Current Psychotropic Meds;Current Sleep Concerns    Source of Stress Concerns Transportation;Financial    Comments Relies on insurance for transportation to appointments, sleeping better      Family Dynamics   Good Support System? No   daughter lives in another town, son lives in [place] , has a close friend but not a confidant   Strains Intra-family strains   kids live away from home, talks to daughter frequently   Concerns Inappropriate over/under dependence on family/friends;No support system      Barriers   Psychosocial barriers to participate in program Psychosocial barriers identified (see note);The patient should benefit from training in stress management and relaxation.      Screening Interventions   Interventions Encouraged to exercise;Provide feedback about the scores to participant;To provide support and resources with identified psychosocial needs    Expected Outcomes Short Term goal: Utilizing psychosocial counselor, staff and physician to assist with identification of specific Stressors or current issues interfering with healing process. Setting desired goal for each stressor or current issue identified.;Long Term Goal: Stressors or current issues are controlled or eliminated.;Short Term goal: Identification and review with participant of any Quality of Life or Depression concerns found by scoring the questionnaire.;Long Term goal: The participant improves quality of Life and PHQ9 Scores as seen by post scores and/or verbalization of changes             Quality of Life Scores:  Quality of Life - 12/17/22 1539       Quality of Life   Select Quality of Life      Quality of Life Scores   Health/Function Pre 19.6 %    Socioeconomic Pre 26.57 %    Psych/Spiritual Pre 30 %    Family Pre 24 %    GLOBAL Pre 23.82 %            Scores of 19 and below usually indicate a poorer quality of life in these areas.  A  difference of  2-3 points is a clinically meaningful difference.  A difference of 2-3 points in the total score of the Quality of Life Index has been associated with significant improvement in overall quality of life, self-image, physical symptoms, and general health in studies assessing change in quality of life.  PHQ-9: Review Flowsheet       12/17/2022  Depression screen PHQ 2/9  Decreased Interest 1  Down, Depressed, Hopeless 1  PHQ - 2 Score 2  Altered sleeping 1  Tired, decreased energy 1  Change in appetite 0  Feeling bad or failure about yourself  0  Trouble concentrating 0  Moving slowly  or fidgety/restless 0  Suicidal thoughts 0  PHQ-9 Score 4  Difficult doing work/chores Somewhat difficult   Interpretation of Total Score  Total Score Depression Severity:  1-4 = Minimal depression, 5-9 = Mild depression, 10-14 = Moderate depression, 15-19 = Moderately severe depression, 20-27 = Severe depression   Psychosocial Evaluation and Intervention:  Psychosocial Evaluation - 12/15/22 1326       Psychosocial Evaluation & Interventions   Interventions Stress management education;Relaxation education;Encouraged to exercise with the program and follow exercise prescription    Comments Jazminn is coming into cardiac rehab after a STEMI and stent to D1.  She is very motivated to get started with rehab and looks forward to strengthening her heart and body.  Prior to the last year, she used to walk a lot but the hills made her stop as they were starting to hurt her back and aggravate her fibromyalgia.  She really wants to work to get back to a walking routinue again, but not have it cause pain.  She is on cymbalta  for her fibromyalgia. Right after her heart attack, she was very depressed and weeping daily, but now she is starting to feel better about herself and more positive.  She has quit smoking and has been successful thus far.  Her last cigarrette was the day of her heart attack and she has  not had one since.  She was commended for her success.  She quit cold turkey as has taken up drawing and crafting in its place.  She also enjoys writing poetry.  Her health is her biggest stressor and her lack of a support system.  She has two kids.  Her daughter lives in a different part of the state and her son is in [place] . She usually talks to her daughter most days but does not like to share her stress and concerns with her as she doesn't want to add to her daughter's stress.  She does have a close friend that lives near by but again does not want to burden her friend with her stressors/concerns.  She does rely on her insurance to cover transportation for medical appointments.  Scheduling transportation can be stressful for her as it usually turns a simple appointment into a full day ordeal.  She will be using transportation to get to rehab and hopes that it will go well for her.    Expected Outcomes short: Attend rehab to exercise to build strength Long: Exercise for mood boost and attend education to learn about heart health    Continue Psychosocial Services  Follow up required by staff             Psychosocial Re-Evaluation:  Psychosocial Re-Evaluation     Row Name 01/05/23 1458 01/19/23 1443 02/23/23 1442         Psychosocial Re-Evaluation   Current issues with Current Psychotropic Meds;History of Depression;Current Stress Concerns Current Psychotropic Meds;History of Depression;Current Stress Concerns Current Psychotropic Meds;History of Depression;Current Stress Concerns     Comments Dalphine is doing well in rehab.  She is feeling better menatlly and feels like she is on track to feeling more like herself. She is going to see foot doctor tomorrow for plantar facistis.  She is sleeping well too.  She enjoys coming to rehab and feels that it is helping her. Laurianne is doing well in rehab. She is feeling good mentally.  She feels like herself again.  She has been eating better and has  noticed that her skin in healthier  too.  She is feeling better.  She got her foot looked at and it is feeling a better with her new orthotics.  She still having some toe issues.  She is still sleeping pretty good overall. Ronnisha is doing well in rehab. She has been feeling good with exericsing and feels an improvment. She has been remodling her house so she has been stressed do to that but it is finished so she is in the process of putting everything back. She has been focusing on healthy eating. She is sleeping pretty good overall.     Expected Outcomes Short: Continue to build back to self Long: COnitnue to exercise for mental boost Short: Continue to feel more like self Long: Continue to stay positive Short: Continue to feel more like self Long: Continue to stay positive     Interventions Encouraged to attend Cardiac Rehabilitation for the exercise Encouraged to attend Cardiac Rehabilitation for the exercise Encouraged to attend Cardiac Rehabilitation for the exercise     Continue Psychosocial Services  Follow up required by staff Follow up required by staff Follow up required by staff              Psychosocial Discharge (Final Psychosocial Re-Evaluation):  Psychosocial Re-Evaluation - 02/23/23 1442       Psychosocial Re-Evaluation   Current issues with Current Psychotropic Meds;History of Depression;Current Stress Concerns    Comments Tzivia is doing well in rehab. She has been feeling good with exericsing and feels an improvment. She has been remodling her house so she has been stressed do to that but it is finished so she is in the process of putting everything back. She has been focusing on healthy eating. She is sleeping pretty good overall.    Expected Outcomes Short: Continue to feel more like self Long: Continue to stay positive    Interventions Encouraged to attend Cardiac Rehabilitation for the exercise    Continue Psychosocial Services  Follow up required by staff              Vocational Rehabilitation: Provide vocational rehab assistance to qualifying candidates.   Vocational Rehab Evaluation & Intervention:  Vocational Rehab - 12/15/22 1319       Initial Vocational Rehab Evaluation & Intervention   Assessment shows need for Vocational Rehabilitation No   disabled            Education: Education Goals: Education classes will be provided on a weekly basis, covering required topics. Participant will state understanding/return demonstration of topics presented.  Learning Barriers/Preferences:  Learning Barriers/Preferences - 12/15/22 1308       Learning Barriers/Preferences   Learning Barriers Sight   glasses   Learning Preferences Written Material;Video;Pictoral             Education Topics: Hypertension, Hypertension Reduction -Define heart disease and high blood pressure. Discus how high blood pressure affects the body and ways to reduce high blood pressure.   Exercise and Your Heart -Discuss why it is important to exercise, the FITT principles of exercise, normal and abnormal responses to exercise, and how to exercise safely. Flowsheet Row CARDIAC REHAB PHASE II EXERCISE from 02/18/2023 in [place] [facility]  Date 02/04/23  Educator hb  Instruction Review Code 1- Verbalizes Understanding       Angina -Discuss definition of angina, causes of angina, treatment of angina, and how to decrease risk of having angina.   Cardiac Medications -Review what the following cardiac medications are used for, how they affect the  body, and side effects that may occur when taking the medications.  Medications include Aspirin , Beta blockers, calcium  channel blockers, ACE Inhibitors, angiotensin receptor blockers, diuretics, digoxin, and antihyperlipidemics. Flowsheet Row CARDIAC REHAB PHASE II EXERCISE from 02/18/2023 in [place] [facility]  Date 01/14/23  Educator jh  Instruction Review Code 1- Verbalizes  Understanding       Congestive Heart Failure -Discuss the definition of CHF, how to live with CHF, the signs and symptoms of CHF, and how keep track of weight and sodium intake. Flowsheet Row CARDIAC REHAB PHASE II EXERCISE from 02/18/2023 in [place] [facility]  Date 01/07/23  Educator [facility]  Instruction Review Code 1- Verbalizes Understanding       Heart Disease and Intimacy -Discus the effect sexual activity has on the heart, how changes occur during intimacy as we age, and safety during sexual activity. Flowsheet Row CARDIAC REHAB PHASE II EXERCISE from 02/18/2023 in [place] [facility]  Date 02/11/23  Educator jh  Instruction Review Code 1- Verbalizes Understanding       Smoking Cessation / COPD -Discuss different methods to quit smoking, the health benefits of quitting smoking, and the definition of COPD.   Nutrition I: Fats -Discuss the types of cholesterol, what cholesterol does to the heart, and how cholesterol levels can be controlled. Flowsheet Row CARDIAC REHAB PHASE II EXERCISE from 02/18/2023 in [place] [facility]  Date 12/24/22  Educator [facility]  Instruction Review Code 1- Verbalizes Understanding       Nutrition II: Labels -Discuss the different components of food labels and how to read food label   Heart Parts/Heart Disease and PAD -Discuss the anatomy of the heart, the pathway of blood circulation through the heart, and these are affected by heart disease. Flowsheet Row CARDIAC REHAB PHASE II EXERCISE from 02/18/2023 in [place] [facility]  Date 01/28/23  Educator jh  Instruction Review Code 1- Verbalizes Understanding       Stress I: Signs and Symptoms -Discuss the causes of stress, how stress may lead to anxiety and depression, and ways to limit stress. Flowsheet Row CARDIAC REHAB PHASE II EXERCISE from 02/18/2023 in [place] [facility]  Date 12/31/22  Educator HB  Instruction  Review Code 1- Verbalizes Understanding       Stress II: Relaxation -Discuss different types of relaxation techniques to limit stress.   Warning Signs of Stroke / TIA -Discuss definition of a stroke, what the signs and symptoms are of a stroke, and how to identify when someone is having stroke.   Knowledge Questionnaire Score:   Core Components/Risk Factors/Patient Goals at Admission:  Personal Goals and Risk Factors at Admission - 12/15/22 1317       Core Components/Risk Factors/Patient Goals on Admission    Weight Management Yes;Weight Maintenance    Intervention Weight Management: Develop a combined nutrition and exercise program designed to reach desired caloric intake, while maintaining appropriate intake of nutrient and fiber, sodium and fats, and appropriate energy expenditure required for the weight goal.;Weight Management: Provide education and appropriate resources to help participant work on and attain dietary goals.    Expected Outcomes Short Term: Continue to assess and modify interventions until short term weight is achieved;Long Term: Adherence to nutrition and physical activity/exercise program aimed toward attainment of established weight goal;Weight Maintenance: Understanding of the daily nutrition guidelines, which includes 25-35% calories from fat, 7% or less cal from saturated fats, less than 200mg  cholesterol, less than 1.5gm of sodium, & 5 or  more servings of fruits and vegetables daily    Tobacco Cessation Yes   quit 12/02/22   Number of packs per day 0    Intervention Offer self-teaching materials, assist with locating and accessing local/national Quit Smoking programs, and support quit date choice.    Expected Outcomes Long Term: Complete abstinence from all tobacco products for at least 12 months from quit date.;Short Term: Will quit all tobacco product use, adhering to prevention of relapse plan.    Hypertension Yes    Intervention Monitor prescription use  compliance.;Provide education on lifestyle modifcations including regular physical activity/exercise, weight management, moderate sodium restriction and increased consumption of fresh fruit, vegetables, and low fat dairy, alcohol moderation, and smoking cessation.    Expected Outcomes Short Term: Continued assessment and intervention until BP is < 140/65mm HG in hypertensive participants. < 130/13mm HG in hypertensive participants with diabetes, heart failure or chronic kidney disease.;Long Term: Maintenance of blood pressure at goal levels.    Lipids Yes    Intervention Provide education and support for participant on nutrition & aerobic/resistive exercise along with prescribed medications to achieve LDL 70mg , HDL >40mg .    Expected Outcomes Short Term: Participant states understanding of desired cholesterol values and is compliant with medications prescribed. Participant is following exercise prescription and nutrition guidelines.;Long Term: Cholesterol controlled with medications as prescribed, with individualized exercise RX and with personalized nutrition plan. Value goals: LDL < 70mg , HDL > 40 mg.             Core Components/Risk Factors/Patient Goals Review:   Goals and Risk Factor Review     Row Name 01/05/23 1452 01/19/23 1448 02/23/23 1447         Core Components/Risk Factors/Patient Goals Review   Personal Goals Review Weight Management/Obesity;Tobacco Cessation;Hypertension;Lipids Weight Management/Obesity;Hypertension;Lipids Weight Management/Obesity;Hypertension;Lipids     Review Maymuna is doing well in rehab.  She missed Friday from not feeling well.  Today, her weight was up some from weekend but feels it was just from eating more over weekend. Her pressures have been running a little high for her in the 120s when she is usually in the 110s.  She is planning to call her doctor. about bruising and pressures. She says the blood thinners are making her bruise easily.  She is doing  well with cessation.  She has not had any cravings recently.  She feels like the heart event was enough to scare her away from it. Pheonix is doing well in rehab.  Her weight is trending down.  She is eating better and feeling better overall.  Her pressures are doing well.  She is doing well on her meds. She is still concerned about the brusing and wants to message her doctor about it.  She also gets SOB when she bends over or squats down. Varonica continues to do well in rehab. Her weight is staying around the same +/- a lbs or 2. She is eating better choices and enjoying new recipes. Her blood pressure has been doing well.     Expected Outcomes Short: Conitnue to work on weight loss and talk to doctor Long: continue to monitor risk factors Short: Message doctor about brusing and breathing Long: Continue to monitor risk factors. Short: continue to monitor blood pressure and weight   long term: continue to exercise for happiness and overall well being              Core Components/Risk Factors/Patient Goals at Discharge (Final Review):   Goals and Risk  Factor Review - 02/23/23 1447       Core Components/Risk Factors/Patient Goals Review   Personal Goals Review Weight Management/Obesity;Hypertension;Lipids    Review Jarely continues to do well in rehab. Her weight is staying around the same +/- a lbs or 2. She is eating better choices and enjoying new recipes. Her blood pressure has been doing well.    Expected Outcomes Short: continue to monitor blood pressure and weight   long term: continue to exercise for happiness and overall well being             ITP Comments:  ITP Comments     Row Name 12/15/22 1325 12/17/22 1532 12/22/22 1449 01/14/23 0802 02/11/23 1019   ITP Comments Completed virtual orientation today.  EP evaluation is scheduled for Wednesday 12/17/22 at 1400 .  Documentation for diagnosis can be found in [facility] encounter 12/02/22. Patient arrived for 1st visit/orientation/education at  1400. Patient was referred to CR by Dr. Ozell Fell due to STEMI/Stent placement. During orientation advised patient on arrival and appointment times what to wear, what to do before, during and after exercise. Reviewed attendance and class policy.  Pt is scheduled to return Cardiac Rehab on 12/22/22 at 1430. Pt was advised to come to class 15 minutes before class starts.  Discussed RPE/Dpysnea scales. Patient participated in warm up stretches. Patient was able to complete 6 minute walk test.  Telemetry:NSR. Patient was measured for the equipment. Discussed equipment safety with patient. Took patient pre-anthropometric measurements. Patient finished visit at 1500. First full day of exercise!  Patient was oriented to gym and equipment including functions, settings, policies, and procedures.  Patient's individual exercise prescription and treatment plan were reviewed.  All starting workloads were established based on the results of the 6 minute walk test done at initial orientation visit.  The plan for exercise progression was also introduced and progression will be customized based on patient's performance and goals. 30 day review completed. ITP sent to Dr. Dorn Ross, Medical Director of Cardiac Rehab. Continue with ITP unless changes are made by physician. 30 day review completed. ITP sent to Dr. Dorn Ross, Medical Director of Cardiac Rehab. Continue with ITP unless changes are made by physician.    Row Name 03/10/23 1012           ITP Comments 30 day review completed. ITP sent to Dr. Dorn Ross, Medical Director of Cardiac Rehab. Continue with ITP unless changes are made by physician.                Comments: 30 day review

## 2023-03-13 ENCOUNTER — Encounter (HOSPITAL_COMMUNITY): Payer: Medicaid Other

## 2023-03-13 NOTE — Telephone Encounter (Signed)
 Med Rec complete, allergies and Pharmacy verified January 3

## 2023-03-15 NOTE — Progress Notes (Deleted)
 Cardiology Office Note:    Date:  03/15/2023   ID:  Sarah Vargas, DOB 04/13/1960, MRN [id number]  PCP:  Baird Comer GAILS, NP   [facility] Providers Cardiologist:  None     Referring MD: Baird Comer GAILS, NP   No chief complaint on file.   History of Present Illness:    Sarah Vargas is a 63 y.o. female with a hx of CAD presenting for follow-up evaluation. She presented with a lateral STEMI in September 2024 and underwent primary PCI of the first diagonal branch. She was noted to have moderate nonobstructive 50% stenosis of the LAD and no other significant CAD at the time. LVEF was normal at 55-65%.    Current Medications: No outpatient medications have been marked as taking for the 03/16/23 encounter (Appointment) with Wonda Sharper, MD.     Allergies:   Patient has no known allergies.   ROS:   Please see the history of present illness.    *** All other systems reviewed and are negative.  EKGs/Labs/Other Studies Reviewed:    The following studies were reviewed today: Cardiac Studies & Procedures   CARDIAC CATHETERIZATION  CARDIAC CATHETERIZATION 12/02/2022  Narrative 1.  Acute lateral wall MI secondary to total occlusion of first diagonal branch, treated with primary PCI using a 2.25 x 16 mm Synergy DES, reducing 100% stenosis to 0%.  TIMI 0 flow at baseline improved to TIMI-3 post PCI.  There is mild to moderate nonobstructive plaquing proximal to the stented segment of the diagonal. 2.  Nonobstructive mid LAD stenosis estimated at 40 to 50% 3.  Patent left main, left circumflex, and RCA with mild irregularities 4.  Mild hypokinesis of the anterolateral wall with preserved overall LVEF estimated at 55 to 65%  Recommend: CV-ICU care, post MI medical therapy, DAPT with aspirin  and ticagrelor  x 12 months without interruption, patient may be a candidate for early post MI discharge pending her clinical course.  Findings Coronary Findings Diagnostic   Dominance: Right  Left Main The vessel exhibits minimal luminal irregularities.  Left Anterior Descending Prox LAD to Mid LAD lesion is 50% stenosed. Hypodense lesion  First Diagonal Branch 1st Diag-1 lesion is 50% stenosed. 1st Diag-2 lesion is 100% stenosed. The lesion is thrombotic.  Ramus Intermedius Vessel is small. Ramus lesion is 40% stenosed.  Left Circumflex The vessel exhibits minimal luminal irregularities.  Right Coronary Artery Vessel is large. There is mild diffuse disease throughout the vessel. Large, dominant RCA with mild luminal irregularities but no significant stenoses.  PDA and PLA branches are widely patent.  Intervention  1st Diag-2 lesion Stent A drug-eluting stent was successfully placed using a SYNERGY XD 2.25X16. Maximum pressure: 14 atm. Post-Intervention Lesion Assessment The intervention was successful. Pre-interventional TIMI flow is 0. Post-intervention TIMI flow is 3. No complications occurred at this lesion. There is a 0% residual stenosis post intervention.    ECHOCARDIOGRAM  ECHOCARDIOGRAM COMPLETE 12/03/2022  Narrative ECHOCARDIOGRAM REPORT    Patient Name:   Sarah Vargas Date of Exam: 12/03/2022 Medical Rec #:  [id number]       Height:       65.0 in Accession #:    [id number]      Weight:       136.9 lb Date of Birth:  Aug 16, 1960       BSA:          1.684 m Patient Age:    62 years        BP:  93/62 mmHg Patient Gender: F               HR:           61 bpm. Exam Location:  Inpatient  Procedure: 2D Echo, Cardiac Doppler and Color Doppler  Indications:    Myocardial Infact I21,9  History:        Patient has no prior history of Echocardiogram examinations. STEMI; Risk Factors:Hypertension, Dyslipidemia and Current Smoker.  Sonographer:    Madeline Finder Referring Phys: [id number] Cleopatra Sardo   Sonographer Comments: Suboptimal parasternal window. IMPRESSIONS   1. Left ventricular ejection fraction, by estimation, is  55 to 60%. The left ventricle has normal function. The left ventricle has no regional wall motion abnormalities. Left ventricular diastolic parameters were normal. 2. Right ventricular systolic function is normal. The right ventricular size is normal. There is normal pulmonary artery systolic pressure. The estimated right ventricular systolic pressure is 29.7 mmHg. 3. The mitral valve is normal in structure. Trivial mitral valve regurgitation. No evidence of mitral stenosis. 4. The aortic valve is normal in structure. Aortic valve regurgitation is not visualized. No aortic stenosis is present. 5. The inferior vena cava is normal in size with <50% respiratory variability, suggesting right atrial pressure of 8 mmHg.  FINDINGS Left Ventricle: Left ventricular ejection fraction, by estimation, is 55 to 60%. The left ventricle has normal function. The left ventricle has no regional wall motion abnormalities. The left ventricular internal cavity size was normal in size. There is no left ventricular hypertrophy. Left ventricular diastolic parameters were normal. Normal left ventricular filling pressure.  Right Ventricle: The right ventricular size is normal. No increase in right ventricular wall thickness. Right ventricular systolic function is normal. There is normal pulmonary artery systolic pressure. The tricuspid regurgitant velocity is 2.33 m/s, and with an assumed right atrial pressure of 8 mmHg, the estimated right ventricular systolic pressure is 29.7 mmHg.  Left Atrium: Left atrial size was normal in size.  Right Atrium: Right atrial size was normal in size.  Pericardium: There is no evidence of pericardial effusion.  Mitral Valve: The mitral valve is normal in structure. Trivial mitral valve regurgitation. No evidence of mitral valve stenosis.  Tricuspid Valve: The tricuspid valve is normal in structure. Tricuspid valve regurgitation is trivial. No evidence of tricuspid stenosis.  Aortic  Valve: The aortic valve is normal in structure. Aortic valve regurgitation is not visualized. No aortic stenosis is present.  Pulmonic Valve: The pulmonic valve was normal in structure. Pulmonic valve regurgitation is not visualized. No evidence of pulmonic stenosis.  Aorta: The aortic root is normal in size and structure.  Venous: The inferior vena cava is normal in size with less than 50% respiratory variability, suggesting right atrial pressure of 8 mmHg.  IAS/Shunts: No atrial level shunt detected by color flow Doppler.   LEFT VENTRICLE PLAX 2D LVIDd:         3.80 cm   Diastology LVIDs:         2.70 cm   LV e' medial:    8.92 cm/s LV PW:         0.90 cm   LV E/e' medial:  9.5 LV IVS:        0.70 cm   LV e' lateral:   11.90 cm/s LVOT diam:     1.70 cm   LV E/e' lateral: 7.1 LV SV:         69 LV SV Index:   41 LVOT Area:  2.27 cm   RIGHT VENTRICLE RV S prime:     9.57 cm/s TAPSE (M-mode): 2.1 cm  LEFT ATRIUM           Index        RIGHT ATRIUM           Index LA diam:      2.60 cm 1.54 cm/m   RA Area:     14.00 cm LA Vol (A2C): 23.7 ml 14.08 ml/m  RA Volume:   29.70 ml  17.64 ml/m LA Vol (A4C): 39.6 ml 23.52 ml/m AORTIC VALVE LVOT Vmax:   147.00 cm/s LVOT Vmean:  94.700 cm/s LVOT VTI:    0.302 m  AORTA Ao Root diam: 3.00 cm Ao Asc diam:  3.00 cm  MITRAL VALVE               TRICUSPID VALVE MV Area (PHT): 3.53 cm    TR Peak grad:   21.7 mmHg MV Decel Time: 215 msec    TR Vmax:        233.00 cm/s MR Peak grad: 76.2 mmHg MR Vmax:      436.50 cm/s  SHUNTS MV E velocity: 84.80 cm/s  Systemic VTI:  0.30 m MV A velocity: 63.40 cm/s  Systemic Diam: 1.70 cm MV E/A ratio:  1.34  Wilbert Bihari MD Electronically signed by Wilbert Bihari MD Signature Date/Time: 12/03/2022/3:39:08 PM    Final             EKG:        Recent Labs: 12/02/2022: ALT 22; BUN 33; Creatinine, Ser 1.07; Hemoglobin 12.8; Platelets 275; Potassium 4.6; Sodium 136  Recent Lipid Panel     Component Value Date/Time   CHOL 154 12/02/2022 2257   TRIG 101 12/02/2022 2257   HDL 54 12/02/2022 2257   CHOLHDL 2.9 12/02/2022 2257   VLDL 20 12/02/2022 2257   LDLCALC 80 12/02/2022 2257     Risk Assessment/Calculations:   {Does this patient have ATRIAL FIBRILLATION?:[id number]}  No BP recorded.  {Refresh Note OR Click here to enter BP  :1}***         Physical Exam:    VS:  There were no vitals taken for this visit.    Wt Readings from Last 3 Encounters:  12/17/22 138 lb 14.2 oz (63 kg)  12/09/22 143 lb (64.9 kg)  12/03/22 136 lb 14.5 oz (62.1 kg)     GEN: *** Well nourished, well developed in no acute distress HEENT: Normal NECK: No JVD; No carotid bruits LYMPHATICS: No lymphadenopathy CARDIAC: ***RRR, no murmurs, rubs, gallops RESPIRATORY:  Clear to auscultation without rales, wheezing or rhonchi  ABDOMEN: Soft, non-tender, non-distended MUSCULOSKELETAL:  No edema; No deformity  SKIN: Warm and dry NEUROLOGIC:  Alert and oriented x 3 PSYCHIATRIC:  Normal affect   Assessment & Plan Coronary artery disease involving native coronary artery of native heart without angina pectoris  Essential hypertension  Mixed hyperlipidemia        {Are you ordering a CV Procedure (e.g. stress test, cath, DCCV, TEE, etc)?   Press F2        :[id number]}    Medication Adjustments/Labs and Tests Ordered: Current medicines are reviewed at length with the patient today.  Concerns regarding medicines are outlined above.  No orders of the defined types were placed in this encounter.  No orders of the defined types were placed in this encounter.   There are no Patient Instructions on file for this visit.   Signed,  Ozell Fell, MD  03/15/2023 8:37 PM    [facility]

## 2023-03-16 ENCOUNTER — Ambulatory Visit: Payer: Medicaid Other | Admitting: Cardiovascular Disease

## 2023-03-16 ENCOUNTER — Encounter (HOSPITAL_COMMUNITY): Payer: Medicaid Other

## 2023-03-16 DIAGNOSIS — I10 Essential (primary) hypertension: Secondary | ICD-10-CM

## 2023-03-16 DIAGNOSIS — I2510 Atherosclerotic heart disease of native coronary artery without angina pectoris: Secondary | ICD-10-CM

## 2023-03-16 DIAGNOSIS — E782 Mixed hyperlipidemia: Secondary | ICD-10-CM

## 2023-03-18 ENCOUNTER — Encounter (HOSPITAL_COMMUNITY): Payer: Medicaid Other

## 2023-03-19 ENCOUNTER — Telehealth (HOSPITAL_COMMUNITY): Payer: Self-pay | Admitting: *Deleted

## 2023-03-19 ENCOUNTER — Encounter (HOSPITAL_COMMUNITY): Payer: Self-pay | Admitting: *Deleted

## 2023-03-19 DIAGNOSIS — Z955 Presence of coronary angioplasty implant and graft: Secondary | ICD-10-CM

## 2023-03-19 DIAGNOSIS — I2129 ST elevation (STEMI) myocardial infarction involving other sites: Secondary | ICD-10-CM

## 2023-03-19 NOTE — Telephone Encounter (Signed)
 Called to check up on patient.  She continues to deal with back pain, but is finally starting to feel better.  She was able to get out to walk some yesterday.  She has a follow up on Tuesday next week with her spine doctor and hopes to be able to return to rehab on Wednesday.

## 2023-03-20 ENCOUNTER — Encounter (HOSPITAL_COMMUNITY): Payer: Medicaid Other

## 2023-03-23 ENCOUNTER — Encounter (HOSPITAL_COMMUNITY): Payer: Medicaid Other

## 2023-03-25 ENCOUNTER — Encounter (HOSPITAL_COMMUNITY): Payer: Medicaid Other

## 2023-03-26 ENCOUNTER — Encounter: Payer: Self-pay | Admitting: Cardiovascular Disease

## 2023-03-26 ENCOUNTER — Ambulatory Visit: Payer: Medicaid Other | Attending: Cardiovascular Disease | Admitting: Cardiovascular Disease

## 2023-03-26 VITALS — BP 120/90 | HR 78 | Ht 64.0 in | Wt 144.6 lb

## 2023-03-26 DIAGNOSIS — I10 Essential (primary) hypertension: Secondary | ICD-10-CM | POA: Diagnosis not present

## 2023-03-26 DIAGNOSIS — E782 Mixed hyperlipidemia: Secondary | ICD-10-CM

## 2023-03-26 NOTE — Patient Instructions (Signed)
Lab Work: Lipids, Liver  If you have labs (blood work) drawn today and your tests are completely normal, you will receive your results only by: MyChart Message (if you have MyChart) OR A paper copy in the mail If you have any lab test that is abnormal or we need to change your treatment, we will call you to review the results.  Follow-Up: At [facility], you and your health needs are our priority.  As part of our continuing mission to provide you with exceptional heart care, we have created designated Provider Care Teams.  These Care Teams include your primary Cardiologist (physician) and Advanced Practice Providers (APPs -  Physician Assistants and Nurse Practitioners) who all work together to provide you with the care you need, when you need it.  Your next appointment:   September 2025  Provider:   Tonny Bollman, MD       1st Floor: - Lobby - Registration  - Pharmacy  - Lab - Cafe  2nd Floor: - PV Lab - Diagnostic Testing (echo, CT, nuclear med)  3rd Floor: - Vacant  4th Floor: - TCTS (cardiothoracic surgery) - AFib Clinic - Structural Heart Clinic - Vascular Surgery  - Vascular Ultrasound  5th Floor: - HeartCare Cardiology (general and EP) - Clinical Pharmacy for coumadin, hypertension, lipid, weight-loss medications, and med management appointments    Valet parking services will be available as well.

## 2023-03-26 NOTE — Progress Notes (Signed)
Cardiology Office Note:    Date:  03/26/2023   ID:  THIERRY GORZYNSKI, DOB 13-Feb-1961, MRN [id number]  PCP:  Rebecka Apley, NP   [facility] Providers Cardiologist:  None     Referring MD: Rebecka Apley, NP   No chief complaint on file.   History of Present Illness:    Sarah Vargas is a 63 y.o. female with a hx of CAD presenting for follow-up evaluation. She presented with a lateral STEMI in September 2024 and underwent primary PCI of the first diagonal branch. She was noted to have moderate nonobstructive 50% stenosis of the LAD and no other significant CAD at the time. LVEF was normal at 55-65%.   She's here alone today. She's been doing pretty well from a cardiac perspective. She injured her back lifting something heavy, but otherwise has no complaints. She plans on starting back to cardiac rehab next week. Today, she denies symptoms of palpitations, chest pain, shortness of breath, orthopnea, PND, lower extremity edema, dizziness, or syncope.  Current Medications: Current Meds  Medication Sig   alendronate (FOSAMAX) 70 MG tablet Take 70 mg by mouth once a week. Take on empty stomach with full glass of water, sit upright for 1 hour after.   aspirin EC 81 MG tablet Take 1 tablet (81 mg total) by mouth daily. Swallow whole.   baclofen (LIORESAL) 10 MG tablet Take 10 mg by mouth 3 (three) times daily.   Calcium Carb-Cholecalciferol (CALCIUM 600 + D PO) Take 1 tablet by mouth in the morning and at bedtime.   DULoxetine (CYMBALTA) 30 MG capsule Take 30 mg by mouth See admin instructions. Take with Duloxetine 60mg  to equal 90mg  total.   DULoxetine (CYMBALTA) 60 MG capsule Take 60 mg by mouth See admin instructions. Take with Duloxetine 30mg  to equal 90mg  total.   loratadine (CLARITIN) 10 MG tablet Take 10 mg by mouth daily.   metoprolol succinate (TOPROL-XL) 25 MG 24 hr tablet Take 0.5 tablets (12.5 mg total) by mouth daily.   Multiple Vitamin (MULTIVITAMIN)  tablet Take 1 tablet by mouth daily.   nitroGLYCERIN (NITROSTAT) 0.4 MG SL tablet Place 1 tablet (0.4 mg total) under the tongue every 5 (five) minutes as needed for chest pain.   pantoprazole (PROTONIX) 40 MG tablet Take 1 tablet (40 mg total) by mouth daily.   rosuvastatin (CRESTOR) 40 MG tablet Take 1 tablet (40 mg total) by mouth daily.   ticagrelor (BRILINTA) 90 MG TABS tablet Take 1 tablet (90 mg total) by mouth 2 (two) times daily.   topiramate (TOPAMAX) 50 MG tablet Take 50 mg by mouth 2 (two) times daily.   traZODone (DESYREL) 150 MG tablet Take 150 mg by mouth at bedtime.   [DISCONTINUED] cyclobenzaprine (FLEXERIL) 5 MG tablet Take 5 mg by mouth 3 (three) times daily as needed.   [DISCONTINUED] predniSONE (DELTASONE) 10 MG tablet SMARTSIG:- Tablet(s) By Mouth -   [DISCONTINUED] triamcinolone ointment (KENALOG) 0.5 % Apply 1 Application topically 2 (two) times daily.     Allergies:   Patient has no known allergies.   ROS:   Please see the history of present illness.    All other systems reviewed and are negative.  EKGs/Labs/Other Studies Reviewed:    The following studies were reviewed today: Cardiac Studies & Procedures   CARDIAC CATHETERIZATION  CARDIAC CATHETERIZATION 12/02/2022  Narrative 1.  Acute lateral wall MI secondary to total occlusion of first diagonal branch, treated with primary PCI using a 2.25 x  16 mm Synergy DES, reducing 100% stenosis to 0%.  TIMI 0 flow at baseline improved to TIMI-3 post PCI.  There is mild to moderate nonobstructive plaquing proximal to the stented segment of the diagonal. 2.  Nonobstructive mid LAD stenosis estimated at 40 to 50% 3.  Patent left main, left circumflex, and RCA with mild irregularities 4.  Mild hypokinesis of the anterolateral wall with preserved overall LVEF estimated at 55 to 65%  Recommend: CV-ICU care, post MI medical therapy, DAPT with aspirin and ticagrelor x 12 months without interruption, patient may be a candidate  for early post MI discharge pending her clinical course.  Findings Coronary Findings Diagnostic  Dominance: Right  Left Main The vessel exhibits minimal luminal irregularities.  Left Anterior Descending Prox LAD to Mid LAD lesion is 50% stenosed. Hypodense lesion  First Diagonal Branch 1st Diag-1 lesion is 50% stenosed. 1st Diag-2 lesion is 100% stenosed. The lesion is thrombotic.  Ramus Intermedius Vessel is small. Ramus lesion is 40% stenosed.  Left Circumflex The vessel exhibits minimal luminal irregularities.  Right Coronary Artery Vessel is large. There is mild diffuse disease throughout the vessel. Large, dominant RCA with mild luminal irregularities but no significant stenoses.  PDA and PLA branches are widely patent.  Intervention  1st Diag-2 lesion Stent A drug-eluting stent was successfully placed using a SYNERGY XD 2.25X16. Maximum pressure: 14 atm. Post-Intervention Lesion Assessment The intervention was successful. Pre-interventional TIMI flow is 0. Post-intervention TIMI flow is 3. No complications occurred at this lesion. There is a 0% residual stenosis post intervention.    ECHOCARDIOGRAM  ECHOCARDIOGRAM COMPLETE 12/03/2022  Narrative ECHOCARDIOGRAM REPORT    Patient Name:   Sarah Vargas Kelter Date of Exam: 12/03/2022 Medical Rec #:  [id number]       Height:       65.0 in Accession #:    [id number]      Weight:       136.9 lb Date of Birth:  Jan 11, 1961       BSA:          1.684 m Patient Age:    62 years        BP:           93/62 mmHg Patient Gender: F               HR:           61 bpm. Exam Location:  Inpatient  Procedure: 2D Echo, Cardiac Doppler and Color Doppler  Indications:    Myocardial Infact I21,9  History:        Patient has no prior history of Echocardiogram examinations. STEMI; Risk Factors:Hypertension, Dyslipidemia and Current Smoker.  Sonographer:    Aron Baba Referring Phys: [phone] Anberlyn Feimster   Sonographer Comments:  Suboptimal parasternal window. IMPRESSIONS   1. Left ventricular ejection fraction, by estimation, is 55 to 60%. The left ventricle has normal function. The left ventricle has no regional wall motion abnormalities. Left ventricular diastolic parameters were normal. 2. Right ventricular systolic function is normal. The right ventricular size is normal. There is normal pulmonary artery systolic pressure. The estimated right ventricular systolic pressure is 29.7 mmHg. 3. The mitral valve is normal in structure. Trivial mitral valve regurgitation. No evidence of mitral stenosis. 4. The aortic valve is normal in structure. Aortic valve regurgitation is not visualized. No aortic stenosis is present. 5. The inferior vena cava is normal in size with <50% respiratory variability, suggesting right atrial pressure of 8 mmHg.  FINDINGS Left Ventricle: Left ventricular ejection fraction, by estimation, is 55 to 60%. The left ventricle has normal function. The left ventricle has no regional wall motion abnormalities. The left ventricular internal cavity size was normal in size. There is no left ventricular hypertrophy. Left ventricular diastolic parameters were normal. Normal left ventricular filling pressure.  Right Ventricle: The right ventricular size is normal. No increase in right ventricular wall thickness. Right ventricular systolic function is normal. There is normal pulmonary artery systolic pressure. The tricuspid regurgitant velocity is 2.33 m/s, and with an assumed right atrial pressure of 8 mmHg, the estimated right ventricular systolic pressure is 29.7 mmHg.  Left Atrium: Left atrial size was normal in size.  Right Atrium: Right atrial size was normal in size.  Pericardium: There is no evidence of pericardial effusion.  Mitral Valve: The mitral valve is normal in structure. Trivial mitral valve regurgitation. No evidence of mitral valve stenosis.  Tricuspid Valve: The tricuspid valve is  normal in structure. Tricuspid valve regurgitation is trivial. No evidence of tricuspid stenosis.  Aortic Valve: The aortic valve is normal in structure. Aortic valve regurgitation is not visualized. No aortic stenosis is present.  Pulmonic Valve: The pulmonic valve was normal in structure. Pulmonic valve regurgitation is not visualized. No evidence of pulmonic stenosis.  Aorta: The aortic root is normal in size and structure.  Venous: The inferior vena cava is normal in size with less than 50% respiratory variability, suggesting right atrial pressure of 8 mmHg.  IAS/Shunts: No atrial level shunt detected by color flow Doppler.   LEFT VENTRICLE PLAX 2D LVIDd:         3.80 cm   Diastology LVIDs:         2.70 cm   LV e' medial:    8.92 cm/s LV PW:         0.90 cm   LV E/e' medial:  9.5 LV IVS:        0.70 cm   LV e' lateral:   11.90 cm/s LVOT diam:     1.70 cm   LV E/e' lateral: 7.1 LV SV:         69 LV SV Index:   41 LVOT Area:     2.27 cm   RIGHT VENTRICLE RV S prime:     9.57 cm/s TAPSE (M-mode): 2.1 cm  LEFT ATRIUM           Index        RIGHT ATRIUM           Index LA diam:      2.60 cm 1.54 cm/m   RA Area:     14.00 cm LA Vol (A2C): 23.7 ml 14.08 ml/m  RA Volume:   29.70 ml  17.64 ml/m LA Vol (A4C): 39.6 ml 23.52 ml/m AORTIC VALVE LVOT Vmax:   147.00 cm/s LVOT Vmean:  94.700 cm/s LVOT VTI:    0.302 m  AORTA Ao Root diam: 3.00 cm Ao Asc diam:  3.00 cm  MITRAL VALVE               TRICUSPID VALVE MV Area (PHT): 3.53 cm    TR Peak grad:   21.7 mmHg MV Decel Time: 215 msec    TR Vmax:        233.00 cm/s MR Peak grad: 76.2 mmHg MR Vmax:      436.50 cm/s  SHUNTS MV E velocity: 84.80 cm/s  Systemic VTI:  0.30 m MV A velocity: 63.40 cm/s  Systemic Diam: 1.70 cm MV E/A ratio:  1.34  Armanda Magic MD Electronically signed by Armanda Magic MD Signature Date/Time: 12/03/2022/3:39:08 PM    Final             EKG:        Recent Labs: 12/02/2022: ALT 22; BUN 33;  Creatinine, Ser 1.07; Hemoglobin 12.8; Platelets 275; Potassium 4.6; Sodium 136  Recent Lipid Panel    Component Value Date/Time   CHOL 154 12/02/2022 2257   TRIG 101 12/02/2022 2257   HDL 54 12/02/2022 2257   CHOLHDL 2.9 12/02/2022 2257   VLDL 20 12/02/2022 2257   LDLCALC 80 12/02/2022 2257           Physical Exam:    VS:  BP (!) 120/90   Pulse 78   Ht 5\' 4"  (1.626 m)   Wt 144 lb 9.6 oz (65.6 kg)   SpO2 98%   BMI 24.82 kg/m     Wt Readings from Last 3 Encounters:  03/26/23 144 lb 9.6 oz (65.6 kg)  12/17/22 138 lb 14.2 oz (63 kg)  12/09/22 143 lb (64.9 kg)     GEN:  Well nourished, well developed in no acute distress HEENT: Normal NECK: No JVD; No carotid bruits LYMPHATICS: No lymphadenopathy CARDIAC: RRR, no murmurs, rubs, gallops RESPIRATORY:  Clear to auscultation without rales, wheezing or rhonchi  ABDOMEN: Soft, non-tender, non-distended MUSCULOSKELETAL:  No edema; No deformity  SKIN: Warm and dry NEUROLOGIC:  Alert and oriented x 3 PSYCHIATRIC:  Normal affect   Assessment & Plan Coronary artery disease involving native coronary artery of native heart without angina pectoris The patient is clinically stable on DAPT with aspirin and ticagrelor, beta-blocker, and a high intensity statin drug.  When she is out to 1 year from her infarct, I think ticagrelor could be discontinued.  She will remain on long-term antiplatelet therapy with low-dose aspirin.  I reviewed her cardiac cath films and she had moderate bifurcation disease of the LAD/first diagonal with total occlusion of the first diagonal further down into the vessel.  She was treated with PCI and the diagonal and her residual disease will be managed medically unless she develops recurrent angina or ischemic symptoms.  Essential hypertension Blood pressure has been well-controlled on metoprolol succinate.  Continue the same.  No changes are made in her medical regimen today.  Mixed hyperlipidemia Lipids were  checked at the time of her MI when her cholesterol is 154, LDL 80, HDL 54, triglycerides 101.  She is now treated with rosuvastatin 40 mg daily.  Will order lipids and LFTs for follow-up.  She does not appear to be having any side effects from her lipid-lowering treatment.            Medication Adjustments/Labs and Tests Ordered: Current medicines are reviewed at length with the patient today.  Concerns regarding medicines are outlined above.  Orders Placed This Encounter  Procedures   Lipid panel   Hepatic function panel   No orders of the defined types were placed in this encounter.   Patient Instructions  Lab Work: Lipids, Liver  If you have labs (blood work) drawn today and your tests are completely normal, you will receive your results only by: MyChart Message (if you have MyChart) OR A paper copy in the mail If you have any lab test that is abnormal or we need to change your treatment, we will call you to review the results.  Follow-Up: At [facility], you and your  health needs are our priority.  As part of our continuing mission to provide you with exceptional heart care, we have created designated Provider Care Teams.  These Care Teams include your primary Cardiologist (physician) and Advanced Practice Providers (APPs -  Physician Assistants and Nurse Practitioners) who all work together to provide you with the care you need, when you need it.  Your next appointment:   September 2025  Provider:   Tonny Bollman, MD       1st Floor: - Lobby - Registration  - Pharmacy  - Lab - Cafe  2nd Floor: - PV Lab - Diagnostic Testing (echo, CT, nuclear med)  3rd Floor: - Vacant  4th Floor: - TCTS (cardiothoracic surgery) - AFib Clinic - Structural Heart Clinic - Vascular Surgery  - Vascular Ultrasound  5th Floor: - HeartCare Cardiology (general and EP) - Clinical Pharmacy for coumadin, hypertension, lipid, weight-loss medications, and med management  appointments    Valet parking services will be available as well.           Signed, Tonny Bollman, MD  03/26/2023 2:25 PM    [facility]

## 2023-03-27 ENCOUNTER — Encounter (HOSPITAL_COMMUNITY): Payer: Medicaid Other

## 2023-03-30 ENCOUNTER — Encounter (HOSPITAL_COMMUNITY)
Admission: RE | Admit: 2023-03-30 | Discharge: 2023-03-30 | Disposition: A | Discharge status: Home / Self Care | Payer: Medicaid Other | Source: Ambulatory Visit | Attending: Cardiovascular Disease | Admitting: Cardiovascular Disease

## 2023-03-30 DIAGNOSIS — Z955 Presence of coronary angioplasty implant and graft: Secondary | ICD-10-CM | POA: Insufficient documentation

## 2023-03-30 DIAGNOSIS — I2129 ST elevation (STEMI) myocardial infarction involving other sites: Secondary | ICD-10-CM | POA: Diagnosis present

## 2023-03-30 NOTE — Progress Notes (Signed)
Daily Session Note  Patient Details  Name: Sarah Vargas MRN: [id number] Date of Birth: 1961/01/04 Referring Provider:   Flowsheet Row CARDIAC REHAB PHASE II ORIENTATION from 12/17/2022 in [facility] CARDIAC REHABILITATION  Referring Provider Tonny Bollman MD       Encounter Date: 03/30/2023  Check In:  Session Check In - 03/30/23 1429       Check-In   Supervising physician immediately available to respond to emergencies See telemetry face sheet for immediately available MD    Location [facility]    Staff Present Ross Ludwig, BS, Exercise Physiologist;Brooke Elwyn Reach, Margarite Gouge, RN, BSN    Virtual Visit No    Medication changes reported     No    Fall or balance concerns reported    No    Tobacco Cessation No Change    Warm-up and Cool-down Performed on first and last piece of equipment    Resistance Training Performed Yes    VAD Patient? No    PAD/SET Patient? No      Pain Assessment   Currently in Pain? No/denies    Pain Score 0-No pain    Multiple Pain Sites No             Capillary Blood Glucose: No results found for this or any previous visit (from the past 24 hours).    Social History   Tobacco Use  Smoking Status Former   Current packs/day: 0.00   Average packs/day: 0.5 packs/day for 30.0 years (15.0 ttl pk-yrs)   Types: Cigarettes   Quit date: 12/02/2022   Years since quitting: 0.3  Smokeless Tobacco Never  Tobacco Comments   Quit 12/02/22    Goals Met:  Independence with exercise equipment Exercise tolerated well No report of concerns or symptoms today Strength training completed today  Goals Unmet:  Not Applicable  Comments: Pt able to follow exercise prescription today without complaint.  Will continue to monitor for progression.

## 2023-04-01 ENCOUNTER — Encounter (HOSPITAL_COMMUNITY)
Admission: RE | Admit: 2023-04-01 | Discharge: 2023-04-01 | Disposition: A | Discharge status: Home / Self Care | Payer: Medicaid Other | Source: Ambulatory Visit | Attending: Cardiovascular Disease | Admitting: Cardiovascular Disease

## 2023-04-01 DIAGNOSIS — Z955 Presence of coronary angioplasty implant and graft: Secondary | ICD-10-CM

## 2023-04-01 DIAGNOSIS — I2129 ST elevation (STEMI) myocardial infarction involving other sites: Secondary | ICD-10-CM

## 2023-04-01 NOTE — Progress Notes (Signed)
Daily Session Note  Patient Details  Name: Sarah Vargas MRN: [id number] Date of Birth: [birth year] Referring Provider:   Flowsheet Row CARDIAC REHAB PHASE II ORIENTATION from 12/17/2022 in [facility] CARDIAC REHABILITATION  Referring Provider Tonny Bollman MD       Encounter Date: 04/01/2023  Check In:  Session Check In - 04/01/23 1415       Check-In   Supervising physician immediately available to respond to emergencies See telemetry face sheet for immediately available ER MD    Location [facility]    Staff Present Ross Ludwig, BS, Exercise Physiologist;Brooke Elwyn Reach, Margarite Gouge, RN, BSN    Virtual Visit No    Medication changes reported     No    Fall or balance concerns reported    No    Warm-up and Cool-down Performed on first and last piece of equipment    Resistance Training Performed Yes    VAD Patient? No    PAD/SET Patient? No      Pain Assessment   Currently in Pain? No/denies    Pain Score 0-No pain    Multiple Pain Sites No             Capillary Blood Glucose: No results found for this or any previous visit (from the past 24 hours).    Social History   Tobacco Use  Smoking Status Former   Current packs/day: 0.00   Average packs/day: 0.5 packs/day for 30.0 years (15.0 ttl pk-yrs)   Types: Cigarettes   Quit date: 12/02/2022   Years since quitting: 0.3  Smokeless Tobacco Never  Tobacco Comments   Quit 12/02/22    Goals Met:  Independence with exercise equipment Exercise tolerated well No report of concerns or symptoms today Strength training completed today  Goals Unmet:  Not Applicable  Comments: Pt able to follow exercise prescription today without complaint.  Will continue to monitor for progression.

## 2023-04-03 ENCOUNTER — Encounter (HOSPITAL_COMMUNITY)
Admission: RE | Admit: 2023-04-03 | Discharge: 2023-04-03 | Disposition: A | Discharge status: Home / Self Care | Payer: Medicaid Other | Source: Ambulatory Visit | Attending: Cardiovascular Disease

## 2023-04-03 VITALS — Ht 64.0 in | Wt 139.6 lb

## 2023-04-03 DIAGNOSIS — I2129 ST elevation (STEMI) myocardial infarction involving other sites: Secondary | ICD-10-CM

## 2023-04-03 DIAGNOSIS — Z955 Presence of coronary angioplasty implant and graft: Secondary | ICD-10-CM

## 2023-04-03 NOTE — Progress Notes (Signed)
Daily Session Note  Patient Details  Name: Sarah Vargas MRN: [id number] Date of Birth: 02/12/1961 Referring Provider:   Flowsheet Row CARDIAC REHAB PHASE II ORIENTATION from 12/17/2022 in [facility] CARDIAC REHABILITATION  Referring Provider Tonny Bollman MD       Encounter Date: 04/03/2023  Check In:  Session Check In - 04/03/23 1415       Check-In   Supervising physician immediately available to respond to emergencies See telemetry face sheet for immediately available ER MD    Location [facility]    Staff Present Ross Ludwig, BS, Exercise Physiologist;Phyllis Billingsley, Margarite Gouge, RN, BSN    Virtual Visit No    Medication changes reported     No    Fall or balance concerns reported    No    Warm-up and Cool-down Performed on first and last piece of equipment    Resistance Training Performed Yes    VAD Patient? No    PAD/SET Patient? No      Pain Assessment   Currently in Pain? No/denies    Pain Score 0-No pain    Multiple Pain Sites No             Capillary Blood Glucose: No results found for this or any previous visit (from the past 24 hours).    Social History   Tobacco Use  Smoking Status Former   Current packs/day: 0.00   Average packs/day: 0.5 packs/day for 30.0 years (15.0 ttl pk-yrs)   Types: Cigarettes   Quit date: 12/02/2022   Years since quitting: 0.3  Smokeless Tobacco Never  Tobacco Comments   Quit 12/02/22    Goals Met:  Independence with exercise equipment Exercise tolerated well No report of concerns or symptoms today Strength training completed today  Goals Unmet:  Not Applicable  Comments: Pt able to follow exercise prescription today without complaint.  Will continue to monitor for progression.

## 2023-04-06 ENCOUNTER — Encounter (HOSPITAL_COMMUNITY): Payer: Medicaid Other

## 2023-04-07 NOTE — Progress Notes (Signed)
Cardiac Individual Treatment Plan  Patient Details  Name: Sarah Vargas MRN: [id number] Date of Birth: 03-14-1960 Referring Provider:   Flowsheet Row CARDIAC REHAB PHASE II ORIENTATION from 12/17/2022 in [facility] CARDIAC REHABILITATION  Referring Provider Tonny Bollman MD       Initial Encounter Date:  Flowsheet Row CARDIAC REHAB PHASE II ORIENTATION from 12/17/2022 in [facility] CARDIAC REHABILITATION  Date 12/17/22       Visit Diagnosis: ST elevation myocardial infarction (STEMI) involving other coronary artery [facility])  Status post coronary artery stent placement  Patient's Home Medications on Admission:  Current Outpatient Medications:    alendronate (FOSAMAX) 70 MG tablet, Take 70 mg by mouth once a week. Take on empty stomach with full glass of water, sit upright for 1 hour after., Disp: , Rfl:    aspirin EC 81 MG tablet, Take 1 tablet (81 mg total) by mouth daily. Swallow whole., Disp: 120 tablet, Rfl: 12   baclofen (LIORESAL) 10 MG tablet, Take 10 mg by mouth 3 (three) times daily., Disp: , Rfl:    Calcium Carb-Cholecalciferol (CALCIUM 600 + D PO), Take 1 tablet by mouth in the morning and at bedtime., Disp: , Rfl:    DULoxetine (CYMBALTA) 30 MG capsule, Take 30 mg by mouth See admin instructions. Take with Duloxetine 60mg  to equal 90mg  total., Disp: , Rfl:    DULoxetine (CYMBALTA) 60 MG capsule, Take 60 mg by mouth See admin instructions. Take with Duloxetine 30mg  to equal 90mg  total., Disp: , Rfl:    loratadine (CLARITIN) 10 MG tablet, Take 10 mg by mouth daily., Disp: , Rfl:    metoprolol succinate (TOPROL-XL) 25 MG 24 hr tablet, Take 0.5 tablets (12.5 mg total) by mouth daily., Disp: 45 tablet, Rfl: 2   Multiple Vitamin (MULTIVITAMIN) tablet, Take 1 tablet by mouth daily., Disp: , Rfl:    nitroGLYCERIN (NITROSTAT) 0.4 MG SL tablet, Place 1 tablet (0.4 mg total) under the tongue every 5 (five) minutes as needed for chest pain., Disp: 25 tablet, Rfl: 3   pantoprazole  (PROTONIX) 40 MG tablet, Take 1 tablet (40 mg total) by mouth daily., Disp: 90 tablet, Rfl: 1   rosuvastatin (CRESTOR) 40 MG tablet, Take 1 tablet (40 mg total) by mouth daily., Disp: 90 tablet, Rfl: 2   ticagrelor (BRILINTA) 90 MG TABS tablet, Take 1 tablet (90 mg total) by mouth 2 (two) times daily., Disp: 180 tablet, Rfl: 3   topiramate (TOPAMAX) 50 MG tablet, Take 50 mg by mouth 2 (two) times daily., Disp: , Rfl:    traZODone (DESYREL) 150 MG tablet, Take 150 mg by mouth at bedtime., Disp: , Rfl:   Past Medical History: Past Medical History:  Diagnosis Date   Back pain    Chest pain    12/2010.   Fibromyalgia    Generalized headaches    GERD (gastroesophageal reflux disease) 12/10/2010   HTN (hypertension) 12/10/2010   Kidney stones    s/p ureteral stents-removed in past.   LBP (low back pain) 12/10/2010   Osteoarthritis    Positive H. pylori test 12/10/2010   Treated 2011.   STEMI (ST elevation myocardial infarction) (HCC) 12/02/2022   Tobacco abuse 12/10/2010    Tobacco Use: Social History   Tobacco Use  Smoking Status Former   Current packs/day: 0.00   Average packs/day: 0.5 packs/day for 30.0 years (15.0 ttl pk-yrs)   Types: Cigarettes   Quit date: 12/02/2022   Years since quitting: 0.3  Smokeless Tobacco Never  Tobacco Comments  Quit 12/02/22    Labs: Review Flowsheet       Latest Ref Rng & Units 12/11/2010 12/02/2022  Labs for ITP Cardiac and Pulmonary Rehab  Cholestrol 0 - 200 mg/dL 829  562   LDL (calc) 0 - 99 mg/dL 130  80   HDL-C >86 mg/dL 37  54   Trlycerides <578 mg/dL 469  629   Hemoglobin B2W 4.8 - 5.6 % - 5.5   TCO2 22 - 32 mmol/L - 20     Capillary Blood Glucose: No results found for: "GLUCAP"   Exercise Target Goals: Exercise Program Goal: Individual exercise prescription set using results from initial 6 min walk test and THRR while considering  patient's activity barriers and safety.   Exercise Prescription Goal: Starting with aerobic  activity 30 plus minutes a day, 3 days per week for initial exercise prescription. Provide home exercise prescription and guidelines that participant acknowledges understanding prior to discharge.  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 HR 80 bpm 122 bpm    Max Ex. BP 120/70 130/72    2 Minute Post BP 108/70 120/70             Oxygen Initial Assessment:   Oxygen Re-Evaluation:   Oxygen Discharge (Final Oxygen Re-Evaluation):   Initial Exercise Prescription:  Initial Exercise Prescription - 12/17/22 1500       Date of Initial Exercise RX and Referring Provider   Date 12/17/22    Referring Provider Tonny Bollman MD      Oxygen   Maintain Oxygen Saturation 88% or higher      Prescription Details   Frequency (times per week) 3    Duration Progress to 30 minutes of continuous aerobic without signs/symptoms of physical distress      Intensity   THRR 40-80% of Max Heartrate 109-142    Ratings of Perceived Exertion 11-13    Perceived Dyspnea 0-4      Resistance Training   Training Prescription Yes     Weight 4 lbs / bluw band    Reps 10-15             Perform Capillary Blood Glucose checks as needed.  Exercise Prescription Changes:   Exercise Prescription Changes     Row Name  12/17/22 1500 01/05/23 1500 01/19/23 1200 01/28/23 1500 02/09/23 1500     Response to Exercise   Blood Pressure (Admit) 100/70 -- 128/60 120/80 122/60   Blood Pressure (Exercise) 120/70 -- -- -- --   Blood Pressure (Exit) 108/70 -- 110/60 118/70 120/62   Heart Rate (Admit) 76 bpm -- 78 bpm 76 bpm 70 bpm   Heart Rate (Exercise) 80 bpm -- 109 bpm 110 bpm 110 bpm   Heart Rate (Exit) 70 bpm -- 87 bpm 102 bpm 75 bpm   Oxygen Saturation (Admit) 97 % -- -- -- --   Oxygen Saturation (Exercise) 95 % -- -- -- --   Oxygen Saturation (Exit) 95 % -- -- -- --   Rating of Perceived Exertion (Exercise) 13 -- 13 13 13    Perceived Dyspnea (Exercise) 1 -- -- -- --   Duration -- -- Continue with 30 min of aerobic exercise without signs/symptoms of physical distress. Continue with 30 min of aerobic exercise without signs/symptoms of physical distress. Continue with 30 min of aerobic exercise without signs/symptoms of physical distress.   Intensity -- -- THRR unchanged THRR unchanged THRR unchanged     Progression   Progression -- -- Continue to progress workloads to maintain intensity without signs/symptoms of physical distress. Continue to progress workloads to maintain intensity without signs/symptoms of physical distress. Continue to progress workloads to maintain intensity without signs/symptoms of physical distress.     Resistance Training   Training Prescription -- -- Yes Yes Yes   Weight -- -- 4 lbs 4 lbs / red band 3 lbs   Reps -- -- 10-15 10-15 10-15     Treadmill   MPH 1.4 -- 1.8 1.5 1.9   Grade 0 -- 1 1 1    Minutes 15 -- 15 15 15    METs 2.4 -- 2.63 2.35 2.72     NuStep   Level 2 -- 2 3 3    SPM 50 -- 74 83 80   Minutes 15 -- 15 15 15    METs 1.8 -- 2.2 2.3 2     Home Exercise Plan   Plans to continue  exercise at -- Home (comment)  walking, staff videos Home (comment) Home (comment) Home (comment)   Frequency -- Add 2 additional days to program exercise sessions. Add 2 additional days to program exercise sessions. Add 2 additional days to program exercise sessions. Add 2 additional days to program exercise sessions.   Initial Home Exercises Provided -- 01/05/23 -- -- --     Oxygen   Maintain Oxygen Saturation -- -- -- 88% or higher 88% or higher            Exercise Comments:   Exercise Comments     Row Name 04/03/23 1151           Exercise Comments Ronita graduated today from  rehab with 36 sessions completed.  Details of the patient's exercise prescription and what She needs to do in order to continue the prescription and progress were discussed with patient.  Patient was given a copy of prescription and goals.  Patient verbalized understanding. Jiayi plans to continue to exercise by walking at home.                Exercise Goals and Review:   Exercise Goals     Row Name 12/15/22 1325 12/17/22 1525           Exercise Goals   Increase Physical Activity Yes Yes  Intervention Provide advice, education, support and counseling about physical activity/exercise needs.;Develop an individualized exercise prescription for aerobic and resistive training based on initial evaluation findings, risk stratification, comorbidities and participant's personal goals. --      Expected Outcomes Short Term: Attend rehab on a regular basis to increase amount of physical activity.;Long Term: Add in home exercise to make exercise part of routine and to increase amount of physical activity.;Long Term: Exercising regularly at least 3-5 days a week. --      Increase Strength and Stamina Yes --      Intervention Provide advice, education, support and counseling about physical activity/exercise needs.;Develop an individualized exercise prescription for aerobic and resistive training based on  initial evaluation findings, risk stratification, comorbidities and participant's personal goals. --      Expected Outcomes Short Term: Increase workloads from initial exercise prescription for resistance, speed, and METs.;Short Term: Perform resistance training exercises routinely during rehab and add in resistance training at home;Long Term: Improve cardiorespiratory fitness, muscular endurance and strength as measured by increased METs and functional capacity ( ) --      Able to understand and use rate of perceived exertion (RPE) scale Yes --      Intervention Provide education and explanation on how to use RPE scale --      Expected Outcomes Short Term: Able to use RPE daily in rehab to express subjective intensity level;Long Term:  Able to use RPE to guide intensity level when exercising independently --      Able to understand and use Dyspnea scale Yes --      Intervention Provide education and explanation on how to use Dyspnea scale --      Expected Outcomes Short Term: Able to use Dyspnea scale daily in rehab to express subjective sense of shortness of breath during exertion;Long Term: Able to use Dyspnea scale to guide intensity level when exercising independently --      Knowledge and understanding of Target Heart Rate Range (THRR) Yes --      Intervention Provide education and explanation of THRR including how the numbers were predicted and where they are located for reference --      Expected Outcomes Short Term: Able to state/look up THRR;Long Term: Able to use THRR to govern intensity when exercising independently;Short Term: Able to use daily as guideline for intensity in rehab --      Able to check pulse independently Yes --      Intervention Provide education and demonstration on how to check pulse in carotid and radial arteries.;Review the importance of being able to check your own pulse for safety during independent exercise --      Expected Outcomes Short Term: Able to explain why  pulse checking is important during independent exercise;Long Term: Able to check pulse independently and accurately --      Understanding of Exercise Prescription Yes --      Intervention Provide education, explanation, and written materials on patient's individual exercise prescription --      Expected Outcomes Short Term: Able to explain program exercise prescription;Long Term: Able to explain home exercise prescription to exercise independently --               Exercise Goals Re-Evaluation :  Exercise Goals Re-Evaluation     Row Name 01/05/23 1500 01/19/23 1441 01/21/23 0825 02/23/23 1436       Exercise Goal Re-Evaluation   Exercise Goals Review Increase Physical Activity;Increase Strength and Stamina;Understanding of Exercise Prescription Increase Physical  Activity;Increase Strength and Stamina;Understanding of Exercise Prescription Increase Physical Activity;Able to understand and use rate of perceived exertion (RPE) scale;Understanding of Exercise Prescription Increase Physical Activity;Increase Strength and Stamina;Understanding of Exercise Prescription    Comments Alverna is doing well in rehab.  She is feeling good and already walking at home.  She has noted that it can be tough with hills.  She will stay on the sidewalk and just sticking between the hills. Reviewed home exercise with pt today.  Pt plans to walk and use staff videos at home for exercise.  Reviewed THR, pulse, RPE, sign and symptoms, pulse oximetery and when to call 911 or MD.  Also discussed weather considerations and indoor options.  Pt voiced understanding. Henreitta is doing well in rehab.  She is feeling good.  She is walking on her off days.  She goes up and down hills in the neighborhood across this street.  She feels like her stamina is starting to get better.  She has not gotten to weights yet, but been unpacking from her remodel. Quatisha is doing well in rehab. She has increased her level on the stepper to level 2.  Will continue to monitor and progress as able. Shophia is doing great at rehab. She has increased her speed on the treadmill and grade 2.0/1.0. She has noticed that she is not getting as tired when doing cleaning at home. Her endurance has increased    Expected Outcomes Short: Start to add in more walking at home Long: Continue to improve stamina Short: Continue to walk on off days and add in weights Long: Conitnue to add in more walking at home. Short: increase treadmill speed  long term: continue to attend rehab Short: add in home exrcise long: continue to eericse at rehab and home              Discharge Exercise Prescription (Final Exercise Prescription Changes):  Exercise Prescription Changes - 02/09/23 1500       Response to Exercise   Blood Pressure (Admit) 122/60    Blood Pressure (Exit) 120/62    Heart Rate (Admit) 70 bpm    Heart Rate (Exercise) 110 bpm    Heart Rate (Exit) 75 bpm    Rating of Perceived Exertion (Exercise) 13    Duration Continue with 30 min of aerobic exercise without signs/symptoms of physical distress.    Intensity THRR unchanged      Progression   Progression Continue to progress workloads to maintain intensity without signs/symptoms of physical distress.      Resistance Training   Training Prescription Yes    Weight 3 lbs    Reps 10-15      Treadmill   MPH 1.9    Grade 1    Minutes 15    METs 2.72      NuStep   Level 3    SPM 80    Minutes 15    METs 2      Home Exercise Plan   Plans to continue exercise at Home (comment)    Frequency Add 2 additional days to program exercise sessions.      Oxygen   Maintain Oxygen Saturation 88% or higher             Nutrition:  Target Goals: Understanding of nutrition guidelines, daily intake of sodium 1500mg , cholesterol 200mg , calories 30% from fat and 7% or less from saturated fats, daily to have 5 or more servings of fruits and vegetables.  Biometrics:  Pre  Biometrics - 12/17/22 1525        Pre Biometrics   Height 5\' 4"  (1.626 m)    Weight 63 kg    Waist Circumference 36 inches    Hip Circumference 40 inches    Waist to Hip Ratio 0.9 %    BMI (Calculated) 23.83    Grip Strength 14.2 kg    Single Leg Stand 5.75 seconds             Post Biometrics - 04/06/23 1348        Post  Biometrics   Height 5\' 4"  (1.626 m)    Weight 63.3 kg    Waist Circumference 34 inches    Hip Circumference 41 inches    Waist to Hip Ratio 0.83 %    BMI (Calculated) 23.94    Grip Strength 18.5 kg    Single Leg Stand 15.64 seconds             Nutrition Therapy Plan and Nutrition Goals:  Nutrition Therapy & Goals - 12/15/22 1326       Nutrition Therapy   Diet DASH Diet: Has purchased book to learn more about it.      Intervention Plan   Intervention Nutrition handout(s) given to patient.    Expected Outcomes Short Term Goal: Understand basic principles of dietary content, such as calories, fat, sodium, cholesterol and nutrients.;Long Term Goal: Adherence to prescribed nutrition plan.             Nutrition Assessments:  MEDIFICTS Score Key: >=70 Need to make dietary changes  40-70 Heart Healthy Diet <= 40 Therapeutic Level Cholesterol Diet  Flowsheet Row CARDIAC REHAB PHASE II ORIENTATION from 12/17/2022 in [facility] CARDIAC REHABILITATION  Picture Your Plate Total Score on Admission 28      Picture Your Plate Scores: <91 Unhealthy dietary pattern with much room for improvement. 41-50 Dietary pattern unlikely to meet recommendations for good health and room for improvement. 51-60 More healthful dietary pattern, with some room for improvement.  >60 Healthy dietary pattern, although there may be some specific behaviors that could be improved.    Nutrition Goals Re-Evaluation:  Nutrition Goals Re-Evaluation     Row Name 01/05/23 1455 01/19/23 1445 02/23/23 1445         Goals   Nutrition Goal Heart Healthy Diet Short: Try out new recipe she was talking  about Long: Continue to eat more heart healthy. healthy eating     Comment Niketa is doing well in rehab.  She is doing well with her diet.  She has really gotten away from salt.  She makes sure to buy low sodium and not add salt.  She is also doing well with adding in more fruits and vegetables.  Her favorite snack is olives and she has found some low salt olives.  She found a new recipe to try that was low salt with fruits and vegetables.  She is a little worried about the jalopneno.  She has also tried salamon for first time. Yvaine is doing well with her diet.  She has noticed that as she has been eating more fruit and vegetables, her skin is healthier and less dry.  She has gotten away from salt and trying new and dfferent seasonings as well.  She still loves her olives and now she can't find the low salt ones.  But she is going to keep trying to find them.  She never tried the new recipe but wants to try it  still.  She also found a zucchini  lemon bread.  She is going to use almond flour with it for more protein. Mahli continues to do well with her diet. She has been finding new recipes to use and has increased her herps at home and getting spices that does not have all the salt. She has enjoyed trying new recipes and cookin.     Expected Outcome Short: Try out new recipe she was talking about Long: Continue to eat more heart healthy. SHort: Try out the new recipes and seasonings Long: Continue to try for more healthy recipes. Short: continue to ry new recipes and find healthy ones you enjoy  long: continue to cook healthy options              Nutrition Goals Discharge (Final Nutrition Goals Re-Evaluation):  Nutrition Goals Re-Evaluation - 02/23/23 1445       Goals   Nutrition Goal healthy eating    Comment Christabella continues to do well with her diet. She has been finding new recipes to use and has increased her herps at home and getting spices that does not have all the salt. She has enjoyed  trying new recipes and cookin.    Expected Outcome Short: continue to ry new recipes and find healthy ones you enjoy  long: continue to cook healthy options             Psychosocial: Target Goals: Acknowledge presence or absence of significant depression and/or stress, maximize coping skills, provide positive support system. Participant is able to verbalize types and ability to use techniques and skills needed for reducing stress and depression.  Initial Review & Psychosocial Screening:  Initial Psych Review & Screening - 12/15/22 1314       Initial Review   Current issues with Current Stress Concerns;History of Depression;Current Depression;Current Anxiety/Panic;Current Psychotropic Meds;Current Sleep Concerns    Source of Stress Concerns Transportation;Financial    Comments Relies on insurance for transportation to appointments, sleeping better      Family Dynamics   Good Support System? No   daughter lives in another town, son lives in [place], has a close friend but not a confidant   Strains Intra-family strains   kids live away from home, talks to daughter frequently   Concerns Inappropriate over/under dependence on family/friends;No support system      Barriers   Psychosocial barriers to participate in program Psychosocial barriers identified (see note);The patient should benefit from training in stress management and relaxation.      Screening Interventions   Interventions Encouraged to exercise;Provide feedback about the scores to participant;To provide support and resources with identified psychosocial needs    Expected Outcomes Short Term goal: Utilizing psychosocial counselor, staff and physician to assist with identification of specific Stressors or current issues interfering with healing process. Setting desired goal for each stressor or current issue identified.;Long Term Goal: Stressors or current issues are controlled or eliminated.;Short Term goal: Identification and  review with participant of any Quality of Life or Depression concerns found by scoring the questionnaire.;Long Term goal: The participant improves quality of Life and PHQ9 Scores as seen by post scores and/or verbalization of changes             Quality of Life Scores:  Quality of Life - 12/17/22 1539       Quality of Life   Select Quality of Life      Quality of Life Scores   Health/Function Pre 19.6 %  Socioeconomic Pre 26.57 %    Psych/Spiritual Pre 30 %    Family Pre 24 %    GLOBAL Pre 23.82 %            Scores of 19 and below usually indicate a poorer quality of life in these areas.  A difference of  2-3 points is a clinically meaningful difference.  A difference of 2-3 points in the total score of the Quality of Life Index has been associated with significant improvement in overall quality of life, self-image, physical symptoms, and general health in studies assessing change in quality of life.  PHQ-9: Review Flowsheet       12/17/2022  Depression screen PHQ 2/9  Decreased Interest 1  Down, Depressed, Hopeless 1  PHQ - 2 Score 2  Altered sleeping 1  Tired, decreased energy 1  Change in appetite 0  Feeling bad or failure about yourself  0  Trouble concentrating 0  Moving slowly or fidgety/restless 0  Suicidal thoughts 0  PHQ-9 Score 4  Difficult doing work/chores Somewhat difficult   Interpretation of Total Score  Total Score Depression Severity:  1-4 = Minimal depression, 5-9 = Mild depression, 10-14 = Moderate depression, 15-19 = Moderately severe depression, 20-27 = Severe depression   Psychosocial Evaluation and Intervention:  Psychosocial Evaluation - 12/15/22 1326       Psychosocial Evaluation & Interventions   Interventions Stress management education;Relaxation education;Encouraged to exercise with the program and follow exercise prescription    Comments Surina is coming into cardiac rehab after a STEMI and stent to D1.  She is very motivated to  get started with rehab and looks forward to strengthening her heart and body.  Prior to the last year, she used to walk a lot but the hills made her stop as they were starting to hurt her back and aggravate her fibromyalgia.  She really wants to work to get back to a walking routinue again, but not have it cause pain.  She is on cymbalta for her fibromyalgia. Right after her heart attack, she was very depressed and weeping daily, but now she is starting to feel better about herself and more positive.  She has quit smoking and has been successful thus far.  Her last cigarrette was the day of her heart attack and she has not had one since.  She was commended for her success.  She quit cold [place] as has taken up drawing and crafting in its place.  She also enjoys writing poetry.  Her health is her biggest stressor and her lack of a support system.  She has two kids.  Her daughter lives in a different part of the state and her son is in [place]. She usually talks to her daughter most days but does not like to share her stress and concerns with her as she doesn't want to add to her daughter's stress.  She does have a close friend that lives near by but again does not want to burden her friend with her stressors/concerns.  She does rely on her insurance to cover transportation for medical appointments.  Scheduling transportation can be stressful for her as it usually turns a simple appointment into a full day ordeal.  She will be using transportation to get to rehab and hopes that it will go well for her.    Expected Outcomes short: Attend rehab to exercise to build strength Long: Exercise for mood boost and attend education to learn about heart health  Continue Psychosocial Services  Follow up required by staff             Psychosocial Re-Evaluation:  Psychosocial Re-Evaluation     Row Name 01/05/23 1458 01/19/23 1443 02/23/23 1442         Psychosocial Re-Evaluation   Current issues with Current  Psychotropic Meds;History of Depression;Current Stress Concerns Current Psychotropic Meds;History of Depression;Current Stress Concerns Current Psychotropic Meds;History of Depression;Current Stress Concerns     Comments Sheran is doing well in rehab.  She is feeling better menatlly and feels like she is on track to feeling more like herself. She is going to see foot doctor tomorrow for plantar facistis.  She is sleeping well too.  She enjoys coming to rehab and feels that it is helping her. Demeshia is doing well in rehab. She is feeling good mentally.  She feels like herself again.  She has been eating better and has noticed that her skin in healthier too.  She is feeling better.  She got her foot looked at and it is feeling a better with her new orthotics.  She still having some toe issues.  She is still sleeping pretty good overall. Clarabel is doing well in rehab. She has been feeling good with exericsing and feels an improvment. She has been remodling her house so she has been stressed do to that but it is finished so she is in the process of putting everything back. She has been focusing on healthy eating. She is sleeping pretty good overall.     Expected Outcomes Short: Continue to build back to self Long: COnitnue to exercise for mental boost Short: Continue to feel more like self Long: Continue to stay positive Short: Continue to feel more like self Long: Continue to stay positive     Interventions Encouraged to attend Cardiac Rehabilitation for the exercise Encouraged to attend Cardiac Rehabilitation for the exercise Encouraged to attend Cardiac Rehabilitation for the exercise     Continue Psychosocial Services  Follow up required by staff Follow up required by staff Follow up required by staff              Psychosocial Discharge (Final Psychosocial Re-Evaluation):  Psychosocial Re-Evaluation - 02/23/23 1442       Psychosocial Re-Evaluation   Current issues with Current Psychotropic  Meds;History of Depression;Current Stress Concerns    Comments Rainee is doing well in rehab. She has been feeling good with exericsing and feels an improvment. She has been remodling her house so she has been stressed do to that but it is finished so she is in the process of putting everything back. She has been focusing on healthy eating. She is sleeping pretty good overall.    Expected Outcomes Short: Continue to feel more like self Long: Continue to stay positive    Interventions Encouraged to attend Cardiac Rehabilitation for the exercise    Continue Psychosocial Services  Follow up required by staff             Vocational Rehabilitation: Provide vocational rehab assistance to qualifying candidates.   Vocational Rehab Evaluation & Intervention:  Vocational Rehab - 12/15/22 1319       Initial Vocational Rehab Evaluation & Intervention   Assessment shows need for Vocational Rehabilitation No   disabled            Education: Education Goals: Education classes will be provided on a weekly basis, covering required topics. Participant will state understanding/return demonstration of topics presented.  Learning Barriers/Preferences:  Learning Barriers/Preferences - 12/15/22 1308       Learning Barriers/Preferences   Learning Barriers Sight   glasses   Learning Preferences Written Material;Video;Pictoral             Education Topics: Hypertension, Hypertension Reduction -Define heart disease and high blood pressure. Discus how high blood pressure affects the body and ways to reduce high blood pressure.   Exercise and Your Heart -Discuss why it is important to exercise, the FITT principles of exercise, normal and abnormal responses to exercise, and how to exercise safely. Flowsheet Row CARDIAC REHAB PHASE II EXERCISE from 02/18/2023 in [place] CARDIAC REHABILITATION  Date 02/04/23  Educator hb  Instruction Review Code 1- Verbalizes Understanding        Angina -Discuss definition of angina, causes of angina, treatment of angina, and how to decrease risk of having angina.   Cardiac Medications -Review what the following cardiac medications are used for, how they affect the body, and side effects that may occur when taking the medications.  Medications include Aspirin, Beta blockers, calcium channel blockers, ACE Inhibitors, angiotensin receptor blockers, diuretics, digoxin, and antihyperlipidemics. Flowsheet Row CARDIAC REHAB PHASE II EXERCISE from 02/18/2023 in [place] CARDIAC REHABILITATION  Date 01/14/23  Educator jh  Instruction Review Code 1- Verbalizes Understanding       Congestive Heart Failure -Discuss the definition of CHF, how to live with CHF, the signs and symptoms of CHF, and how keep track of weight and sodium intake. Flowsheet Row CARDIAC REHAB PHASE II EXERCISE from 02/18/2023 in [place] CARDIAC REHABILITATION  Date 01/07/23  Educator [facility]  Instruction Review Code 1- Verbalizes Understanding       Heart Disease and Intimacy -Discus the effect sexual activity has on the heart, how changes occur during intimacy as we age, and safety during sexual activity. Flowsheet Row CARDIAC REHAB PHASE II EXERCISE from 02/18/2023 in [place] CARDIAC REHABILITATION  Date 02/11/23  Educator jh  Instruction Review Code 1- Verbalizes Understanding       Smoking Cessation / COPD -Discuss different methods to quit smoking, the health benefits of quitting smoking, and the definition of COPD.   Nutrition I: Fats -Discuss the types of cholesterol, what cholesterol does to the heart, and how cholesterol levels can be controlled. Flowsheet Row CARDIAC REHAB PHASE II EXERCISE from 02/18/2023 in [place] CARDIAC REHABILITATION  Date 12/24/22  Educator [facility]  Instruction Review Code 1- Verbalizes Understanding       Nutrition II: Labels -Discuss the different components of food labels and how to read food  label   Heart Parts/Heart Disease and PAD -Discuss the anatomy of the heart, the pathway of blood circulation through the heart, and these are affected by heart disease. Flowsheet Row CARDIAC REHAB PHASE II EXERCISE from 02/18/2023 in [place] CARDIAC REHABILITATION  Date 01/28/23  Educator jh  Instruction Review Code 1- Verbalizes Understanding       Stress I: Signs and Symptoms -Discuss the causes of stress, how stress may lead to anxiety and depression, and ways to limit stress. Flowsheet Row CARDIAC REHAB PHASE II EXERCISE from 02/18/2023 in [place] CARDIAC REHABILITATION  Date 12/31/22  Educator HB  Instruction Review Code 1- Verbalizes Understanding       Stress II: Relaxation -Discuss different types of relaxation techniques to limit stress.   Warning Signs of Stroke / TIA -Discuss definition of a stroke, what the signs and symptoms are of a stroke, and how to identify when someone is having  stroke.   Knowledge Questionnaire Score:   Core Components/Risk Factors/Patient Goals at Admission:  Personal Goals and Risk Factors at Admission - 12/15/22 1317       Core Components/Risk Factors/Patient Goals on Admission    Weight Management Yes;Weight Maintenance    Intervention Weight Management: Develop a combined nutrition and exercise program designed to reach desired caloric intake, while maintaining appropriate intake of nutrient and fiber, sodium and fats, and appropriate energy expenditure required for the weight goal.;Weight Management: Provide education and appropriate resources to help participant work on and attain dietary goals.    Expected Outcomes Short Term: Continue to assess and modify interventions until short term weight is achieved;Long Term: Adherence to nutrition and physical activity/exercise program aimed toward attainment of established weight goal;Weight Maintenance: Understanding of the daily nutrition guidelines, which includes 25-35% calories  from fat, 7% or less cal from saturated fats, less than 200mg  cholesterol, less than 1.5gm of sodium, & 5 or more servings of fruits and vegetables daily    Tobacco Cessation Yes   quit 12/02/22   Number of packs per day 0    Intervention Offer self-teaching materials, assist with locating and accessing local/national Quit Smoking programs, and support quit date choice.    Expected Outcomes Long Term: Complete abstinence from all tobacco products for at least 12 months from quit date.;Short Term: Will quit all tobacco product use, adhering to prevention of relapse plan.    Hypertension Yes    Intervention Monitor prescription use compliance.;Provide education on lifestyle modifcations including regular physical activity/exercise, weight management, moderate sodium restriction and increased consumption of fresh fruit, vegetables, and low fat dairy, alcohol moderation, and smoking cessation.    Expected Outcomes Short Term: Continued assessment and intervention until BP is < 140/83mm HG in hypertensive participants. < 130/75mm HG in hypertensive participants with diabetes, heart failure or chronic kidney disease.;Long Term: Maintenance of blood pressure at goal levels.    Lipids Yes    Intervention Provide education and support for participant on nutrition & aerobic/resistive exercise along with prescribed medications to achieve LDL 70mg , HDL >40mg .    Expected Outcomes Short Term: Participant states understanding of desired cholesterol values and is compliant with medications prescribed. Participant is following exercise prescription and nutrition guidelines.;Long Term: Cholesterol controlled with medications as prescribed, with individualized exercise RX and with personalized nutrition plan. Value goals: LDL < 70mg , HDL > 40 mg.             Core Components/Risk Factors/Patient Goals Review:   Goals and Risk Factor Review     Row Name 01/05/23 1452 01/19/23 1448 02/23/23 1447         Core  Components/Risk Factors/Patient Goals Review   Personal Goals Review Weight Management/Obesity;Tobacco Cessation;Hypertension;Lipids Weight Management/Obesity;Hypertension;Lipids Weight Management/Obesity;Hypertension;Lipids     Review Akiko is doing well in rehab.  She missed Friday from not feeling well.  Today, her weight was up some from weekend but feels it was just from eating more over weekend. Her pressures have been running a little high for her in the 120s when she is usually in the 110s.  She is planning to call her doctor. about bruising and pressures. She says the blood thinners are making her bruise easily.  She is doing well with cessation.  She has not had any cravings recently.  She feels like the heart event was enough to scare her away from it. Rylei is doing well in rehab.  Her weight is trending down.  She is eating  better and feeling better overall.  Her pressures are doing well.  She is doing well on her meds. She is still concerned about the brusing and wants to message her doctor about it.  She also gets SOB when she bends over or squats down. Marijo continues to do well in rehab. Her weight is staying around the same +/- a lbs or 2. She is eating better choices and enjoying new recipes. Her blood pressure has been doing well.     Expected Outcomes Short: Conitnue to work on weight loss and talk to doctor Long: continue to monitor risk factors Short: Message doctor about brusing and breathing Long: Continue to monitor risk factors. Short: continue to monitor blood pressure and weight   long term: continue to exercise for happiness and overall well being              Core Components/Risk Factors/Patient Goals at Discharge (Final Review):   Goals and Risk Factor Review - 02/23/23 1447       Core Components/Risk Factors/Patient Goals Review   Personal Goals Review Weight Management/Obesity;Hypertension;Lipids    Review Rilla continues to do well in rehab. Her weight is  staying around the same +/- a lbs or 2. She is eating better choices and enjoying new recipes. Her blood pressure has been doing well.    Expected Outcomes Short: continue to monitor blood pressure and weight   long term: continue to exercise for happiness and overall well being             ITP Comments:  ITP Comments     Row Name 12/15/22 1325 12/17/22 1532 12/22/22 1449 01/14/23 0802 02/11/23 1019   ITP Comments Completed virtual orientation today.  EP evaluation is scheduled for Wednesday 12/17/22 at 1400 .  Documentation for diagnosis can be found in [facility] encounter 12/02/22. Patient arrived for 1st visit/orientation/education at 1400. Patient was referred to CR by Dr. Tonny Bollman due to STEMI/Stent placement. During orientation advised patient on arrival and appointment times what to wear, what to do before, during and after exercise. Reviewed attendance and class policy.  Pt is scheduled to return Cardiac Rehab on 12/22/22 at 1430. Pt was advised to come to class 15 minutes before class starts.  Discussed RPE/Dpysnea scales. Patient participated in warm up stretches. Patient was able to complete 6 minute walk test.  Telemetry:NSR. Patient was measured for the equipment. Discussed equipment safety with patient. Took patient pre-anthropometric measurements. Patient finished visit at 1500. First full day of exercise!  Patient was oriented to gym and equipment including functions, settings, policies, and procedures.  Patient's individual exercise prescription and treatment plan were reviewed.  All starting workloads were established based on the results of the 6 minute walk test done at initial orientation visit.  The plan for exercise progression was also introduced and progression will be customized based on patient's performance and goals. 30 day review completed. ITP sent to Dr. Dina Rich, Medical Director of Cardiac Rehab. Continue with ITP unless changes are made by physician. 30 day review  completed. ITP sent to Dr. Dina Rich, Medical Director of Cardiac Rehab. Continue with ITP unless changes are made by physician.    Row Name 03/10/23 1012 03/19/23 1132 04/03/23 1151       ITP Comments 30 day review completed. ITP sent to Dr. Dina Rich, Medical Director of Cardiac Rehab. Continue with ITP unless changes are made by physician. Called to check up on patient.  She continues to deal with  back pain, but is finally starting to feel better.  She was able to get out to walk some yesterday.  She has a follow up on Tuesday next week with her spine doctor and hopes to be able to return to rehab on Wednesday. Laurette graduated today from  rehab with 36 sessions completed.  Details of the patient's exercise prescription and what She needs to do in order to continue the prescription and progress were discussed with patient.  Patient was given a copy of prescription and goals.  Patient verbalized understanding. Trinita plans to continue to exercise by walking at home.              Comments: Discharge ITP

## 2023-04-07 NOTE — Progress Notes (Signed)
Discharge Progress Report  Patient Details  Name: Sarah Vargas MRN: [id number] Date of Birth: 01/14/1961 Referring Provider:   Flowsheet Row CARDIAC REHAB PHASE II ORIENTATION from 12/17/2022 in [facility] C[facility] RE[facility]  Referring Provider Tonny Bollman MD        Number of Visits: 36  Reason for Discharge:  Patient reached a stable level of exercise. Patient independent in their exercise. Patient has met program and personal goals.  Smoking History:  Social History   Tobacco Use  Smoking Status Former   Current packs/day: 0.00   Average packs/day: 0.5 packs/day for 30.0 years (15.0 ttl pk-yrs)   Types: Cigarettes   Quit date: 12/02/2022   Years since quitting: 0.3  Smokeless Tobacco Never  Tobacco Comments   Quit 12/02/22    Diagnosis:  ST elevation myocardial infarction (STEMI) involving other coronary artery (HCC)  Status post coronary artery stent placement  Initial Exercise Prescription:  Initial Exercise Prescription - 12/17/22 1500       Date of Initial Exercise RX and Referring Provider   Date 12/17/22    Referring Provider Tonny Bollman MD      Oxygen   Maintain Oxygen Saturation 88% or higher      Prescription Details   Frequency (times per week) 3    Duration Progress to 30 minutes of continuous aerobic without signs/symptoms of physical distress      Intensity   THRR 40-80% of Max Heartrate 109-142    Ratings of Perceived Exertion 11-13    Perceived Dyspnea 0-4      Resistance Training   Training Prescription Yes    Weight 4 lbs / bluw band    Reps 10-15             Discharge Exercise Prescription (Final Exercise Prescription Changes):  Exercise Prescription Changes - 02/09/23 1500       Response to Exercise   Blood Pressure (Admit) 122/60    Blood Pressure (Exit) 120/62    Heart Rate (Admit) 70 bpm    Heart Rate (Exercise) 110 bpm    Heart Rate (Exit) 75 bpm    Rating of Perceived Exertion (Exercise) 13     Duration Continue with 30 min of aerobic exercise without signs/symptoms of physical distress.    Intensity THRR unchanged      Progression   Progression Continue to progress workloads to maintain intensity without signs/symptoms of physical distress.      Resistance Training   Training Prescription Yes    Weight 3 lbs    Reps 10-15      Treadmill   MPH 1.9    Grade 1    Minutes 15    METs 2.72      NuStep   Level 3    SPM 80    Minutes 15    METs 2      Home Exercise Plan   Plans to continue exercise at Home (comment)    Frequency Add 2 additional days to program exercise sessions.      Oxygen   Maintain Oxygen Saturation 88% or higher             Functional Capacity:  6 Minute Walk     Row Name 12/17/22 1515 04/06/23 1347       6 Minute Walk   Phase Initial Discharge    Distance 1325 feet 1550 feet    Distance Feet Change -- 225 ft    Walk Time 6 minutes 6 minutes    #  of Rest Breaks 0 0    MPH 2.5 2.93    METS 3.31 4.16    RPE 13 12    Perceived Dyspnea  1 0    VO2 Peak 11.59 14.57    Symptoms No No    Resting HR 76 bpm 81 bpm    Resting BP 100/70 120/78    Resting Oxygen Saturation  97 % 97 %    Exercise Oxygen Saturation  during 6 min walk 95 % 99 %    Max Ex. HR 80 bpm 122 bpm    Max Ex. BP 120/70 130/72    2 Minute Post BP 108/70 120/70             Psychological, QOL, Others - Outcomes: PHQ 2/9:    12/17/2022    2:47 PM  Depression screen PHQ 2/9  Decreased Interest 1  Down, Depressed, Hopeless 1  PHQ - 2 Score 2  Altered sleeping 1  Tired, decreased energy 1  Change in appetite 0  Feeling bad or failure about yourself  0  Trouble concentrating 0  Moving slowly or fidgety/restless 0  Suicidal thoughts 0  PHQ-9 Score 4  Difficult doing work/chores Somewhat difficult     Nutrition & Weight - Outcomes:  Pre Biometrics - 12/17/22 1525       Pre Biometrics   Height 5\' 4"  (1.626 m)    Weight 63 kg    Waist Circumference  36 inches    Hip Circumference 40 inches    Waist to Hip Ratio 0.9 %    BMI (Calculated) 23.83    Grip Strength 14.2 kg    Single Leg Stand 5.75 seconds             Post Biometrics - 04/06/23 1348        Post  Biometrics   Height 5\' 4"  (1.626 m)    Weight 63.3 kg    Waist Circumference 34 inches    Hip Circumference 41 inches    Waist to Hip Ratio 0.83 %    BMI (Calculated) 23.94    Grip Strength 18.5 kg    Single Leg Stand 15.64 seconds

## 2023-04-07 NOTE — Progress Notes (Signed)
Sarah Vargas graduated today from  rehab with 36 sessions completed.  Details of the patient's exercise prescription and what She needs to do in order to continue the prescription and progress were discussed with patient.  Patient was given a copy of prescription and goals.  Patient verbalized understanding. Sarah Vargas plans to continue to exercise by walking at home.

## 2023-04-08 ENCOUNTER — Encounter (HOSPITAL_COMMUNITY): Payer: Medicaid Other

## 2023-04-09 NOTE — Progress Notes (Signed)
Cardiac Individual Treatment Plan  Patient Details  Name: Sarah Vargas MRN: [id number] Date of Birth: 25-Aug-1960 Referring Provider:   Flowsheet Row CARDIAC REHAB PHASE II ORIENTATION from 12/17/2022 in [facility] CARDIAC REHABILITATION  Referring Provider Tonny Bollman MD       Initial Encounter Date:  Flowsheet Row CARDIAC REHAB PHASE II ORIENTATION from 12/17/2022 in [place] CARDIAC REHABILITATION  Date 12/17/22       Visit Diagnosis: ST elevation myocardial infarction (STEMI) involving other coronary artery [facility])  Status post coronary artery stent placement  Patient's Home Medications on Admission:  Current Outpatient Medications:    alendronate (FOSAMAX) 70 MG tablet, Take 70 mg by mouth once a week. Take on empty stomach with full glass of water, sit upright for 1 hour after., Disp: , Rfl:    aspirin EC 81 MG tablet, Take 1 tablet (81 mg total) by mouth daily. Swallow whole., Disp: 120 tablet, Rfl: 12   baclofen (LIORESAL) 10 MG tablet, Take 10 mg by mouth 3 (three) times daily., Disp: , Rfl:    Calcium Carb-Cholecalciferol (CALCIUM 600 + D PO), Take 1 tablet by mouth in the morning and at bedtime., Disp: , Rfl:    DULoxetine (CYMBALTA) 30 MG capsule, Take 30 mg by mouth See admin instructions. Take with Duloxetine 60mg  to equal 90mg  total., Disp: , Rfl:    DULoxetine (CYMBALTA) 60 MG capsule, Take 60 mg by mouth See admin instructions. Take with Duloxetine 30mg  to equal 90mg  total., Disp: , Rfl:    loratadine (CLARITIN) 10 MG tablet, Take 10 mg by mouth daily., Disp: , Rfl:    metoprolol succinate (TOPROL-XL) 25 MG 24 hr tablet, Take 0.5 tablets (12.5 mg total) by mouth daily., Disp: 45 tablet, Rfl: 2   Multiple Vitamin (MULTIVITAMIN) tablet, Take 1 tablet by mouth daily., Disp: , Rfl:    nitroGLYCERIN (NITROSTAT) 0.4 MG SL tablet, Place 1 tablet (0.4 mg total) under the tongue every 5 (five) minutes as needed for chest pain., Disp: 25 tablet, Rfl: 3   pantoprazole  (PROTONIX) 40 MG tablet, Take 1 tablet (40 mg total) by mouth daily., Disp: 90 tablet, Rfl: 1   rosuvastatin (CRESTOR) 40 MG tablet, Take 1 tablet (40 mg total) by mouth daily., Disp: 90 tablet, Rfl: 2   ticagrelor (BRILINTA) 90 MG TABS tablet, Take 1 tablet (90 mg total) by mouth 2 (two) times daily., Disp: 180 tablet, Rfl: 3   topiramate (TOPAMAX) 50 MG tablet, Take 50 mg by mouth 2 (two) times daily., Disp: , Rfl:    traZODone (DESYREL) 150 MG tablet, Take 150 mg by mouth at bedtime., Disp: , Rfl:   Past Medical History: Past Medical History:  Diagnosis Date   Back pain    Chest pain    12/2010.   Fibromyalgia    Generalized headaches    GERD (gastroesophageal reflux disease) 12/10/2010   HTN (hypertension) 12/10/2010   Kidney stones    s/p ureteral stents-removed in past.   LBP (low back pain) 12/10/2010   Osteoarthritis    Positive H. pylori test 12/10/2010   Treated 2011.   STEMI (ST elevation myocardial infarction) (HCC) 12/02/2022   Tobacco abuse 12/10/2010    Tobacco Use: Social History   Tobacco Use  Smoking Status Former   Current packs/day: 0.00   Average packs/day: 0.5 packs/day for 30.0 years (15.0 ttl pk-yrs)   Types: Cigarettes   Quit date: 12/02/2022   Years since quitting: 0.3  Smokeless Tobacco Never  Tobacco Comments  Quit 12/02/22    Labs: Review Flowsheet       Latest Ref Rng & Units 12/11/2010 12/02/2022  Labs for ITP Cardiac and Pulmonary Rehab  Cholestrol 0 - 200 mg/dL 130  865   LDL (calc) 0 - 99 mg/dL 784  80   HDL-C >69 mg/dL 37  54   Trlycerides <629 mg/dL 528  413   Hemoglobin K4M 4.8 - 5.6 % - 5.5   TCO2 22 - 32 mmol/L - 20     Capillary Blood Glucose: No results found for: "GLUCAP"   Exercise Target Goals: Exercise Program Goal: Individual exercise prescription set using results from initial 6 min walk test and THRR while considering  patient's activity barriers and safety.   Exercise Prescription Goal: Starting with aerobic  activity 30 plus minutes a day, 3 days per week for initial exercise prescription. Provide home exercise prescription and guidelines that participant acknowledges understanding prior to discharge.  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 HR 80 bpm 122 bpm    Max Ex. BP 120/70 130/72    2 Minute Post BP 108/70 120/70             Oxygen Initial Assessment:   Oxygen Re-Evaluation:   Oxygen Discharge (Final Oxygen Re-Evaluation):   Initial Exercise Prescription:  Initial Exercise Prescription - 12/17/22 1500       Date of Initial Exercise RX and Referring Provider   Date 12/17/22    Referring Provider Tonny Bollman MD      Oxygen   Maintain Oxygen Saturation 88% or higher      Prescription Details   Frequency (times per week) 3    Duration Progress to 30 minutes of continuous aerobic without signs/symptoms of physical distress      Intensity   THRR 40-80% of Max Heartrate 109-142    Ratings of Perceived Exertion 11-13    Perceived Dyspnea 0-4      Resistance Training   Training Prescription Yes     Weight 4 lbs / bluw band    Reps 10-15             Perform Capillary Blood Glucose checks as needed.  Exercise Prescription Changes:   Exercise Prescription Changes     Row Name  12/17/22 1500 01/05/23 1500 01/19/23 1200 01/28/23 1500 02/09/23 1500     Response to Exercise   Blood Pressure (Admit) 100/70 -- 128/60 120/80 122/60   Blood Pressure (Exercise) 120/70 -- -- -- --   Blood Pressure (Exit) 108/70 -- 110/60 118/70 120/62   Heart Rate (Admit) 76 bpm -- 78 bpm 76 bpm 70 bpm   Heart Rate (Exercise) 80 bpm -- 109 bpm 110 bpm 110 bpm   Heart Rate (Exit) 70 bpm -- 87 bpm 102 bpm 75 bpm   Oxygen Saturation (Admit) 97 % -- -- -- --   Oxygen Saturation (Exercise) 95 % -- -- -- --   Oxygen Saturation (Exit) 95 % -- -- -- --   Rating of Perceived Exertion (Exercise) 13 -- 13 13 13    Perceived Dyspnea (Exercise) 1 -- -- -- --   Duration -- -- Continue with 30 min of aerobic exercise without signs/symptoms of physical distress. Continue with 30 min of aerobic exercise without signs/symptoms of physical distress. Continue with 30 min of aerobic exercise without signs/symptoms of physical distress.   Intensity -- -- THRR unchanged THRR unchanged THRR unchanged     Progression   Progression -- -- Continue to progress workloads to maintain intensity without signs/symptoms of physical distress. Continue to progress workloads to maintain intensity without signs/symptoms of physical distress. Continue to progress workloads to maintain intensity without signs/symptoms of physical distress.     Resistance Training   Training Prescription -- -- Yes Yes Yes   Weight -- -- 4 lbs 4 lbs / red band 3 lbs   Reps -- -- 10-15 10-15 10-15     Treadmill   MPH 1.4 -- 1.8 1.5 1.9   Grade 0 -- 1 1 1    Minutes 15 -- 15 15 15    METs 2.4 -- 2.63 2.35 2.72     NuStep   Level 2 -- 2 3 3    SPM 50 -- 74 83 80   Minutes 15 -- 15 15 15    METs 1.8 -- 2.2 2.3 2     Home Exercise Plan   Plans to continue  exercise at -- Home (comment)  walking, staff videos Home (comment) Home (comment) Home (comment)   Frequency -- Add 2 additional days to program exercise sessions. Add 2 additional days to program exercise sessions. Add 2 additional days to program exercise sessions. Add 2 additional days to program exercise sessions.   Initial Home Exercises Provided -- 01/05/23 -- -- --     Oxygen   Maintain Oxygen Saturation -- -- -- 88% or higher 88% or higher            Exercise Comments:   Exercise Comments     Row Name 04/03/23 1151           Exercise Comments Jemia graduated today from  rehab with 36 sessions completed.  Details of the patient's exercise prescription and what She needs to do in order to continue the prescription and progress were discussed with patient.  Patient was given a copy of prescription and goals.  Patient verbalized understanding. Kanesha plans to continue to exercise by walking at home.                Exercise Goals and Review:   Exercise Goals     Row Name 12/15/22 1325 12/17/22 1525           Exercise Goals   Increase Physical Activity Yes Yes  Intervention Provide advice, education, support and counseling about physical activity/exercise needs.;Develop an individualized exercise prescription for aerobic and resistive training based on initial evaluation findings, risk stratification, comorbidities and participant's personal goals. --      Expected Outcomes Short Term: Attend rehab on a regular basis to increase amount of physical activity.;Long Term: Add in home exercise to make exercise part of routine and to increase amount of physical activity.;Long Term: Exercising regularly at least 3-5 days a week. --      Increase Strength and Stamina Yes --      Intervention Provide advice, education, support and counseling about physical activity/exercise needs.;Develop an individualized exercise prescription for aerobic and resistive training based on  initial evaluation findings, risk stratification, comorbidities and participant's personal goals. --      Expected Outcomes Short Term: Increase workloads from initial exercise prescription for resistance, speed, and METs.;Short Term: Perform resistance training exercises routinely during rehab and add in resistance training at home;Long Term: Improve cardiorespiratory fitness, muscular endurance and strength as measured by increased METs and functional capacity ( ) --      Able to understand and use rate of perceived exertion (RPE) scale Yes --      Intervention Provide education and explanation on how to use RPE scale --      Expected Outcomes Short Term: Able to use RPE daily in rehab to express subjective intensity level;Long Term:  Able to use RPE to guide intensity level when exercising independently --      Able to understand and use Dyspnea scale Yes --      Intervention Provide education and explanation on how to use Dyspnea scale --      Expected Outcomes Short Term: Able to use Dyspnea scale daily in rehab to express subjective sense of shortness of breath during exertion;Long Term: Able to use Dyspnea scale to guide intensity level when exercising independently --      Knowledge and understanding of Target Heart Rate Range (THRR) Yes --      Intervention Provide education and explanation of THRR including how the numbers were predicted and where they are located for reference --      Expected Outcomes Short Term: Able to state/look up THRR;Long Term: Able to use THRR to govern intensity when exercising independently;Short Term: Able to use daily as guideline for intensity in rehab --      Able to check pulse independently Yes --      Intervention Provide education and demonstration on how to check pulse in carotid and radial arteries.;Review the importance of being able to check your own pulse for safety during independent exercise --      Expected Outcomes Short Term: Able to explain why  pulse checking is important during independent exercise;Long Term: Able to check pulse independently and accurately --      Understanding of Exercise Prescription Yes --      Intervention Provide education, explanation, and written materials on patient's individual exercise prescription --      Expected Outcomes Short Term: Able to explain program exercise prescription;Long Term: Able to explain home exercise prescription to exercise independently --               Exercise Goals Re-Evaluation :  Exercise Goals Re-Evaluation     Row Name 01/05/23 1500 01/19/23 1441 01/21/23 0825 02/23/23 1436       Exercise Goal Re-Evaluation   Exercise Goals Review Increase Physical Activity;Increase Strength and Stamina;Understanding of Exercise Prescription Increase Physical  Activity;Increase Strength and Stamina;Understanding of Exercise Prescription Increase Physical Activity;Able to understand and use rate of perceived exertion (RPE) scale;Understanding of Exercise Prescription Increase Physical Activity;Increase Strength and Stamina;Understanding of Exercise Prescription    Comments Marliyah is doing well in rehab.  She is feeling good and already walking at home.  She has noted that it can be tough with hills.  She will stay on the sidewalk and just sticking between the hills. Reviewed home exercise with pt today.  Pt plans to walk and use staff videos at home for exercise.  Reviewed THR, pulse, RPE, sign and symptoms, pulse oximetery and when to call 911 or MD.  Also discussed weather considerations and indoor options.  Pt voiced understanding. Avin is doing well in rehab.  She is feeling good.  She is walking on her off days.  She goes up and down hills in the neighborhood across this street.  She feels like her stamina is starting to get better.  She has not gotten to weights yet, but been unpacking from her remodel. Jessalynn is doing well in rehab. She has increased her level on the stepper to level 2.  Will continue to monitor and progress as able. Shophia is doing great at rehab. She has increased her speed on the treadmill and grade 2.0/1.0. She has noticed that she is not getting as tired when doing cleaning at home. Her endurance has increased    Expected Outcomes Short: Start to add in more walking at home Long: Continue to improve stamina Short: Continue to walk on off days and add in weights Long: Conitnue to add in more walking at home. Short: increase treadmill speed  long term: continue to attend rehab Short: add in home exrcise long: continue to eericse at rehab and home              Discharge Exercise Prescription (Final Exercise Prescription Changes):  Exercise Prescription Changes - 02/09/23 1500       Response to Exercise   Blood Pressure (Admit) 122/60    Blood Pressure (Exit) 120/62    Heart Rate (Admit) 70 bpm    Heart Rate (Exercise) 110 bpm    Heart Rate (Exit) 75 bpm    Rating of Perceived Exertion (Exercise) 13    Duration Continue with 30 min of aerobic exercise without signs/symptoms of physical distress.    Intensity THRR unchanged      Progression   Progression Continue to progress workloads to maintain intensity without signs/symptoms of physical distress.      Resistance Training   Training Prescription Yes    Weight 3 lbs    Reps 10-15      Treadmill   MPH 1.9    Grade 1    Minutes 15    METs 2.72      NuStep   Level 3    SPM 80    Minutes 15    METs 2      Home Exercise Plan   Plans to continue exercise at Home (comment)    Frequency Add 2 additional days to program exercise sessions.      Oxygen   Maintain Oxygen Saturation 88% or higher             Nutrition:  Target Goals: Understanding of nutrition guidelines, daily intake of sodium 1500mg , cholesterol 200mg , calories 30% from fat and 7% or less from saturated fats, daily to have 5 or more servings of fruits and vegetables.  Biometrics:  Pre  Biometrics - 12/17/22 1525        Pre Biometrics   Height 5\' 4"  (1.626 m)    Weight 63 kg    Waist Circumference 36 inches    Hip Circumference 40 inches    Waist to Hip Ratio 0.9 %    BMI (Calculated) 23.83    Grip Strength 14.2 kg    Single Leg Stand 5.75 seconds             Post Biometrics - 04/06/23 1348        Post  Biometrics   Height 5\' 4"  (1.626 m)    Weight 63.3 kg    Waist Circumference 34 inches    Hip Circumference 41 inches    Waist to Hip Ratio 0.83 %    BMI (Calculated) 23.94    Grip Strength 18.5 kg    Single Leg Stand 15.64 seconds             Nutrition Therapy Plan and Nutrition Goals:  Nutrition Therapy & Goals - 12/15/22 1326       Nutrition Therapy   Diet DASH Diet: Has purchased book to learn more about it.      Intervention Plan   Intervention Nutrition handout(s) given to patient.    Expected Outcomes Short Term Goal: Understand basic principles of dietary content, such as calories, fat, sodium, cholesterol and nutrients.;Long Term Goal: Adherence to prescribed nutrition plan.             Nutrition Assessments:  MEDIFICTS Score Key: >=70 Need to make dietary changes  40-70 Heart Healthy Diet <= 40 Therapeutic Level Cholesterol Diet  Flowsheet Row CARDIAC REHAB PHASE II ORIENTATION from 12/17/2022 in [facility] CARDIAC REHABILITATION  Picture Your Plate Total Score on Admission 28      Picture Your Plate Scores: <16 Unhealthy dietary pattern with much room for improvement. 41-50 Dietary pattern unlikely to meet recommendations for good health and room for improvement. 51-60 More healthful dietary pattern, with some room for improvement.  >60 Healthy dietary pattern, although there may be some specific behaviors that could be improved.    Nutrition Goals Re-Evaluation:  Nutrition Goals Re-Evaluation     Row Name 01/05/23 1455 01/19/23 1445 02/23/23 1445         Goals   Nutrition Goal Heart Healthy Diet Short: Try out new recipe she was talking  about Long: Continue to eat more heart healthy. healthy eating     Comment Ily is doing well in rehab.  She is doing well with her diet.  She has really gotten away from salt.  She makes sure to buy low sodium and not add salt.  She is also doing well with adding in more fruits and vegetables.  Her favorite snack is olives and she has found some low salt olives.  She found a new recipe to try that was low salt with fruits and vegetables.  She is a little worried about the jalopneno.  She has also tried salamon for first time. Zowie is doing well with her diet.  She has noticed that as she has been eating more fruit and vegetables, her skin is healthier and less dry.  She has gotten away from salt and trying new and dfferent seasonings as well.  She still loves her olives and now she can't find the low salt ones.  But she is going to keep trying to find them.  She never tried the new recipe but wants to try it  still.  She also found a zucchini  lemon bread.  She is going to use almond flour with it for more protein. Sheralyn continues to do well with her diet. She has been finding new recipes to use and has increased her herps at home and getting spices that does not have all the salt. She has enjoyed trying new recipes and cookin.     Expected Outcome Short: Try out new recipe she was talking about Long: Continue to eat more heart healthy. SHort: Try out the new recipes and seasonings Long: Continue to try for more healthy recipes. Short: continue to ry new recipes and find healthy ones you enjoy  long: continue to cook healthy options              Nutrition Goals Discharge (Final Nutrition Goals Re-Evaluation):  Nutrition Goals Re-Evaluation - 02/23/23 1445       Goals   Nutrition Goal healthy eating    Comment Frederica continues to do well with her diet. She has been finding new recipes to use and has increased her herps at home and getting spices that does not have all the salt. She has enjoyed  trying new recipes and cookin.    Expected Outcome Short: continue to ry new recipes and find healthy ones you enjoy  long: continue to cook healthy options             Psychosocial: Target Goals: Acknowledge presence or absence of significant depression and/or stress, maximize coping skills, provide positive support system. Participant is able to verbalize types and ability to use techniques and skills needed for reducing stress and depression.  Initial Review & Psychosocial Screening:  Initial Psych Review & Screening - 12/15/22 1314       Initial Review   Current issues with Current Stress Concerns;History of Depression;Current Depression;Current Anxiety/Panic;Current Psychotropic Meds;Current Sleep Concerns    Source of Stress Concerns Transportation;Financial    Comments Relies on insurance for transportation to appointments, sleeping better      Family Dynamics   Good Support System? No   daughter lives in another town, son lives in [place], has a close friend but not a confidant   Strains Intra-family strains   kids live away from home, talks to daughter frequently   Concerns Inappropriate over/under dependence on family/friends;No support system      Barriers   Psychosocial barriers to participate in program Psychosocial barriers identified (see note);The patient should benefit from training in stress management and relaxation.      Screening Interventions   Interventions Encouraged to exercise;Provide feedback about the scores to participant;To provide support and resources with identified psychosocial needs    Expected Outcomes Short Term goal: Utilizing psychosocial counselor, staff and physician to assist with identification of specific Stressors or current issues interfering with healing process. Setting desired goal for each stressor or current issue identified.;Long Term Goal: Stressors or current issues are controlled or eliminated.;Short Term goal: Identification and  review with participant of any Quality of Life or Depression concerns found by scoring the questionnaire.;Long Term goal: The participant improves quality of Life and PHQ9 Scores as seen by post scores and/or verbalization of changes             Quality of Life Scores:  Quality of Life - 04/09/23 0822       Quality of Life   Select Quality of Life      Quality of Life Scores   Health/Function Pre 19.6 %  Health/Function Post 30 %    Health/Function % Change 53.06 %    Socioeconomic Pre 26.57 %    Socioeconomic Post 30 %    Socioeconomic % Change  12.91 %    Psych/Spiritual Pre 30 %    Psych/Spiritual Post 30 %    Psych/Spiritual % Change 0 %    Family Pre 24 %    Family Post 30 %    Family % Change 25 %    GLOBAL Pre 23.82 %    GLOBAL Post 30 %    GLOBAL % Change 25.94 %            Scores of 19 and below usually indicate a poorer quality of life in these areas.  A difference of  2-3 points is a clinically meaningful difference.  A difference of 2-3 points in the total score of the Quality of Life Index has been associated with significant improvement in overall quality of life, self-image, physical symptoms, and general health in studies assessing change in quality of life.  PHQ-9: Review Flowsheet       04/09/2023 12/17/2022  Depression screen PHQ 2/9  Decreased Interest 0 1  Down, Depressed, Hopeless 0 1  PHQ - 2 Score 0 2  Altered sleeping 0 1  Tired, decreased energy 0 1  Change in appetite 0 0  Feeling bad or failure about yourself  0 0  Trouble concentrating 0 0  Moving slowly or fidgety/restless 0 0  Suicidal thoughts 0 0  PHQ-9 Score 0 4  Difficult doing work/chores - Somewhat difficult   Interpretation of Total Score  Total Score Depression Severity:  1-4 = Minimal depression, 5-9 = Mild depression, 10-14 = Moderate depression, 15-19 = Moderately severe depression, 20-27 = Severe depression   Psychosocial Evaluation and Intervention:   Psychosocial Evaluation - 12/15/22 1326       Psychosocial Evaluation & Interventions   Interventions Stress management education;Relaxation education;Encouraged to exercise with the program and follow exercise prescription    Comments Katerine is coming into cardiac rehab after a STEMI and stent to D1.  She is very motivated to get started with rehab and looks forward to strengthening her heart and body.  Prior to the last year, she used to walk a lot but the hills made her stop as they were starting to hurt her back and aggravate her fibromyalgia.  She really wants to work to get back to a walking routinue again, but not have it cause pain.  She is on cymbalta for her fibromyalgia. Right after her heart attack, she was very depressed and weeping daily, but now she is starting to feel better about herself and more positive.  She has quit smoking and has been successful thus far.  Her last cigarrette was the day of her heart attack and she has not had one since.  She was commended for her success.  She quit cold [place] as has taken up drawing and crafting in its place.  She also enjoys writing poetry.  Her health is her biggest stressor and her lack of a support system.  She has two kids.  Her daughter lives in a different part of the state and her son is in [place]. She usually talks to her daughter most days but does not like to share her stress and concerns with her as she doesn't want to add to her daughter's stress.  She does have a close friend that lives near by but again does not  want to burden her friend with her stressors/concerns.  She does rely on her insurance to cover transportation for medical appointments.  Scheduling transportation can be stressful for her as it usually turns a simple appointment into a full day ordeal.  She will be using transportation to get to rehab and hopes that it will go well for her.    Expected Outcomes short: Attend rehab to exercise to build strength Long: Exercise  for mood boost and attend education to learn about heart health    Continue Psychosocial Services  Follow up required by staff             Psychosocial Re-Evaluation:  Psychosocial Re-Evaluation     Row Name 01/05/23 1458 01/19/23 1443 02/23/23 1442         Psychosocial Re-Evaluation   Current issues with Current Psychotropic Meds;History of Depression;Current Stress Concerns Current Psychotropic Meds;History of Depression;Current Stress Concerns Current Psychotropic Meds;History of Depression;Current Stress Concerns     Comments Benedetta is doing well in rehab.  She is feeling better menatlly and feels like she is on track to feeling more like herself. She is going to see foot doctor tomorrow for plantar facistis.  She is sleeping well too.  She enjoys coming to rehab and feels that it is helping her. Zuzanna is doing well in rehab. She is feeling good mentally.  She feels like herself again.  She has been eating better and has noticed that her skin in healthier too.  She is feeling better.  She got her foot looked at and it is feeling a better with her new orthotics.  She still having some toe issues.  She is still sleeping pretty good overall. Taraya is doing well in rehab. She has been feeling good with exericsing and feels an improvment. She has been remodling her house so she has been stressed do to that but it is finished so she is in the process of putting everything back. She has been focusing on healthy eating. She is sleeping pretty good overall.     Expected Outcomes Short: Continue to build back to self Long: COnitnue to exercise for mental boost Short: Continue to feel more like self Long: Continue to stay positive Short: Continue to feel more like self Long: Continue to stay positive     Interventions Encouraged to attend Cardiac Rehabilitation for the exercise Encouraged to attend Cardiac Rehabilitation for the exercise Encouraged to attend Cardiac Rehabilitation for the exercise      Continue Psychosocial Services  Follow up required by staff Follow up required by staff Follow up required by staff              Psychosocial Discharge (Final Psychosocial Re-Evaluation):  Psychosocial Re-Evaluation - 02/23/23 1442       Psychosocial Re-Evaluation   Current issues with Current Psychotropic Meds;History of Depression;Current Stress Concerns    Comments Evie is doing well in rehab. She has been feeling good with exericsing and feels an improvment. She has been remodling her house so she has been stressed do to that but it is finished so she is in the process of putting everything back. She has been focusing on healthy eating. She is sleeping pretty good overall.    Expected Outcomes Short: Continue to feel more like self Long: Continue to stay positive    Interventions Encouraged to attend Cardiac Rehabilitation for the exercise    Continue Psychosocial Services  Follow up required by staff  Vocational Rehabilitation: Provide vocational rehab assistance to qualifying candidates.   Vocational Rehab Evaluation & Intervention:  Vocational Rehab - 12/15/22 1319       Initial Vocational Rehab Evaluation & Intervention   Assessment shows need for Vocational Rehabilitation No   disabled            Education: Education Goals: Education classes will be provided on a weekly basis, covering required topics. Participant will state understanding/return demonstration of topics presented.  Learning Barriers/Preferences:  Learning Barriers/Preferences - 12/15/22 1308       Learning Barriers/Preferences   Learning Barriers Sight   glasses   Learning Preferences Written Material;Video;Pictoral             Education Topics: Hypertension, Hypertension Reduction -Define heart disease and high blood pressure. Discus how high blood pressure affects the body and ways to reduce high blood pressure.   Exercise and Your Heart -Discuss why it is  important to exercise, the FITT principles of exercise, normal and abnormal responses to exercise, and how to exercise safely. Flowsheet Row CARDIAC REHAB PHASE II EXERCISE from 02/18/2023 in [place] CARDIAC REHABILITATION  Date 02/04/23  Educator hb  Instruction Review Code 1- Verbalizes Understanding       Angina -Discuss definition of angina, causes of angina, treatment of angina, and how to decrease risk of having angina.   Cardiac Medications -Review what the following cardiac medications are used for, how they affect the body, and side effects that may occur when taking the medications.  Medications include Aspirin, Beta blockers, calcium channel blockers, ACE Inhibitors, angiotensin receptor blockers, diuretics, digoxin, and antihyperlipidemics. Flowsheet Row CARDIAC REHAB PHASE II EXERCISE from 02/18/2023 in [place] CARDIAC REHABILITATION  Date 01/14/23  Educator jh  Instruction Review Code 1- Verbalizes Understanding       Congestive Heart Failure -Discuss the definition of CHF, how to live with CHF, the signs and symptoms of CHF, and how keep track of weight and sodium intake. Flowsheet Row CARDIAC REHAB PHASE II EXERCISE from 02/18/2023 in [place] CARDIAC REHABILITATION  Date 01/07/23  Educator [facility]  Instruction Review Code 1- Verbalizes Understanding       Heart Disease and Intimacy -Discus the effect sexual activity has on the heart, how changes occur during intimacy as we age, and safety during sexual activity. Flowsheet Row CARDIAC REHAB PHASE II EXERCISE from 02/18/2023 in [place] CARDIAC REHABILITATION  Date 02/11/23  Educator jh  Instruction Review Code 1- Verbalizes Understanding       Smoking Cessation / COPD -Discuss different methods to quit smoking, the health benefits of quitting smoking, and the definition of COPD.   Nutrition I: Fats -Discuss the types of cholesterol, what cholesterol does to the heart, and how cholesterol levels can  be controlled. Flowsheet Row CARDIAC REHAB PHASE II EXERCISE from 02/18/2023 in [place] CARDIAC REHABILITATION  Date 12/24/22  Educator [facility]  Instruction Review Code 1- Verbalizes Understanding       Nutrition II: Labels -Discuss the different components of food labels and how to read food label   Heart Parts/Heart Disease and PAD -Discuss the anatomy of the heart, the pathway of blood circulation through the heart, and these are affected by heart disease. Flowsheet Row CARDIAC REHAB PHASE II EXERCISE from 02/18/2023 in [place] CARDIAC REHABILITATION  Date 01/28/23  Educator jh  Instruction Review Code 1- Verbalizes Understanding       Stress I: Signs and Symptoms -Discuss the causes of stress, how stress may lead  to anxiety and depression, and ways to limit stress. Flowsheet Row CARDIAC REHAB PHASE II EXERCISE from 02/18/2023 in [place] CARDIAC REHABILITATION  Date 12/31/22  Educator [facility]  Instruction Review Code 1- Verbalizes Understanding       Stress II: Relaxation -Discuss different types of relaxation techniques to limit stress.   Warning Signs of Stroke / TIA -Discuss definition of a stroke, what the signs and symptoms are of a stroke, and how to identify when someone is having stroke.   Knowledge Questionnaire Score:   Core Components/Risk Factors/Patient Goals at Admission:  Personal Goals and Risk Factors at Admission - 12/15/22 1317       Core Components/Risk Factors/Patient Goals on Admission    Weight Management Yes;Weight Maintenance    Intervention Weight Management: Develop a combined nutrition and exercise program designed to reach desired caloric intake, while maintaining appropriate intake of nutrient and fiber, sodium and fats, and appropriate energy expenditure required for the weight goal.;Weight Management: Provide education and appropriate resources to help participant work on and attain dietary goals.    Expected Outcomes Short Term:  Continue to assess and modify interventions until short term weight is achieved;Long Term: Adherence to nutrition and physical activity/exercise program aimed toward attainment of established weight goal;Weight Maintenance: Understanding of the daily nutrition guidelines, which includes 25-35% calories from fat, 7% or less cal from saturated fats, less than 200mg  cholesterol, less than 1.5gm of sodium, & 5 or more servings of fruits and vegetables daily    Tobacco Cessation Yes   quit 12/02/22   Number of packs per day 0    Intervention Offer self-teaching materials, assist with locating and accessing local/national Quit Smoking programs, and support quit date choice.    Expected Outcomes Long Term: Complete abstinence from all tobacco products for at least 12 months from quit date.;Short Term: Will quit all tobacco product use, adhering to prevention of relapse plan.    Hypertension Yes    Intervention Monitor prescription use compliance.;Provide education on lifestyle modifcations including regular physical activity/exercise, weight management, moderate sodium restriction and increased consumption of fresh fruit, vegetables, and low fat dairy, alcohol moderation, and smoking cessation.    Expected Outcomes Short Term: Continued assessment and intervention until BP is < 140/23mm HG in hypertensive participants. < 130/73mm HG in hypertensive participants with diabetes, heart failure or chronic kidney disease.;Long Term: Maintenance of blood pressure at goal levels.    Lipids Yes    Intervention Provide education and support for participant on nutrition & aerobic/resistive exercise along with prescribed medications to achieve LDL 70mg , HDL >40mg .    Expected Outcomes Short Term: Participant states understanding of desired cholesterol values and is compliant with medications prescribed. Participant is following exercise prescription and nutrition guidelines.;Long Term: Cholesterol controlled with medications  as prescribed, with individualized exercise RX and with personalized nutrition plan. Value goals: LDL < 70mg , HDL > 40 mg.             Core Components/Risk Factors/Patient Goals Review:   Goals and Risk Factor Review     Row Name 01/05/23 1452 01/19/23 1448 02/23/23 1447         Core Components/Risk Factors/Patient Goals Review   Personal Goals Review Weight Management/Obesity;Tobacco Cessation;Hypertension;Lipids Weight Management/Obesity;Hypertension;Lipids Weight Management/Obesity;Hypertension;Lipids     Review Sebastian is doing well in rehab.  She missed Friday from not feeling well.  Today, her weight was up some from weekend but feels it was just from eating more over weekend. Her pressures have been  running a little high for her in the 120s when she is usually in the 110s.  She is planning to call her doctor. about bruising and pressures. She says the blood thinners are making her bruise easily.  She is doing well with cessation.  She has not had any cravings recently.  She feels like the heart event was enough to scare her away from it. Dollene is doing well in rehab.  Her weight is trending down.  She is eating better and feeling better overall.  Her pressures are doing well.  She is doing well on her meds. She is still concerned about the brusing and wants to message her doctor about it.  She also gets SOB when she bends over or squats down. Journee continues to do well in rehab. Her weight is staying around the same +/- a lbs or 2. She is eating better choices and enjoying new recipes. Her blood pressure has been doing well.     Expected Outcomes Short: Conitnue to work on weight loss and talk to doctor Long: continue to monitor risk factors Short: Message doctor about brusing and breathing Long: Continue to monitor risk factors. Short: continue to monitor blood pressure and weight   long term: continue to exercise for happiness and overall well being              Core  Components/Risk Factors/Patient Goals at Discharge (Final Review):   Goals and Risk Factor Review - 02/23/23 1447       Core Components/Risk Factors/Patient Goals Review   Personal Goals Review Weight Management/Obesity;Hypertension;Lipids    Review Sarah Vargas continues to do well in rehab. Her weight is staying around the same +/- a lbs or 2. She is eating better choices and enjoying new recipes. Her blood pressure has been doing well.    Expected Outcomes Short: continue to monitor blood pressure and weight   long term: continue to exercise for happiness and overall well being             ITP Comments:  ITP Comments     Row Name 12/15/22 1325 12/17/22 1532 12/22/22 1449 01/14/23 0802 02/11/23 1019   ITP Comments Completed virtual orientation today.  EP evaluation is scheduled for Wednesday 12/17/22 at 1400 .  Documentation for diagnosis can be found in [facility] encounter 12/02/22. Patient arrived for 1st visit/orientation/education at 1400. Patient was referred to CR by Dr. Tonny Bollman due to STEMI/Stent placement. During orientation advised patient on arrival and appointment times what to wear, what to do before, during and after exercise. Reviewed attendance and class policy.  Pt is scheduled to return Cardiac Rehab on 12/22/22 at 1430. Pt was advised to come to class 15 minutes before class starts.  Discussed RPE/Dpysnea scales. Patient participated in warm up stretches. Patient was able to complete 6 minute walk test.  Telemetry:NSR. Patient was measured for the equipment. Discussed equipment safety with patient. Took patient pre-anthropometric measurements. Patient finished visit at 1500. First full day of exercise!  Patient was oriented to gym and equipment including functions, settings, policies, and procedures.  Patient's individual exercise prescription and treatment plan were reviewed.  All starting workloads were established based on the results of the 6 minute walk test done at initial  orientation visit.  The plan for exercise progression was also introduced and progression will be customized based on patient's performance and goals. 30 day review completed. ITP sent to Dr. Dina Rich, Medical Director of Cardiac Rehab. Continue with ITP unless  changes are made by physician. 30 day review completed. ITP sent to Dr. Dina Rich, Medical Director of Cardiac Rehab. Continue with ITP unless changes are made by physician.    Row Name 03/10/23 1012 03/19/23 1132 04/03/23 1151       ITP Comments 30 day review completed. ITP sent to Dr. Dina Rich, Medical Director of Cardiac Rehab. Continue with ITP unless changes are made by physician. Called to check up on patient.  She continues to deal with back pain, but is finally starting to feel better.  She was able to get out to walk some yesterday.  She has a follow up on Tuesday next week with her spine doctor and hopes to be able to return to rehab on Wednesday. Avalina graduated today from  rehab with 36 sessions completed.  Details of the patient's exercise prescription and what She needs to do in order to continue the prescription and progress were discussed with patient.  Patient was given a copy of prescription and goals.  Patient verbalized understanding. Birdena plans to continue to exercise by walking at home.              Comments: discharge ITP updated

## 2023-04-10 ENCOUNTER — Encounter (HOSPITAL_COMMUNITY): Payer: Medicaid Other

## 2023-04-13 ENCOUNTER — Encounter (HOSPITAL_COMMUNITY): Payer: Medicaid Other

## 2023-04-15 ENCOUNTER — Encounter (HOSPITAL_COMMUNITY): Payer: Medicaid Other

## 2023-04-17 ENCOUNTER — Encounter (HOSPITAL_COMMUNITY): Payer: Medicaid Other

## 2023-04-20 ENCOUNTER — Encounter (HOSPITAL_COMMUNITY): Payer: Medicaid Other

## 2023-04-20 LAB — LIPID PANEL
Chol/HDL Ratio: 2.1 {ratio} (ref 0.0–4.4)
Cholesterol, Total: 152 mg/dL (ref 100–199)
HDL: 73 mg/dL (ref >39)
LDL Chol Calc (NIH): 59 mg/dL (ref 0–99)
Triglycerides: 117 mg/dL (ref 0–149)
VLDL Cholesterol Cal: 20 mg/dL (ref 5–40)

## 2023-04-20 LAB — HEPATIC FUNCTION PANEL
ALT: 28 [IU]/L (ref 0–32)
AST: 37 [IU]/L (ref 0–40)
Albumin: 4.5 g/dL (ref 3.9–4.9)
Alkaline Phosphatase: 102 [IU]/L (ref 44–121)
Bilirubin Total: 0.2 mg/dL (ref 0.0–1.2)
Bilirubin, Direct: 0.1 mg/dL (ref 0.00–0.40)
Total Protein: 6.7 g/dL (ref 6.0–8.5)

## 2023-04-22 ENCOUNTER — Encounter (HOSPITAL_COMMUNITY): Payer: Medicaid Other

## 2023-04-24 ENCOUNTER — Encounter (HOSPITAL_COMMUNITY): Payer: Medicaid Other

## 2023-04-27 ENCOUNTER — Encounter (HOSPITAL_COMMUNITY): Payer: Medicaid Other

## 2023-04-28 ENCOUNTER — Other Ambulatory Visit: Payer: Self-pay

## 2023-04-28 ENCOUNTER — Ambulatory Visit (HOSPITAL_COMMUNITY): Payer: Medicaid Other | Attending: Pain Medicine

## 2023-04-28 DIAGNOSIS — M546 Pain in thoracic spine: Secondary | ICD-10-CM | POA: Insufficient documentation
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
## 2023-04-29 ENCOUNTER — Encounter (HOSPITAL_COMMUNITY): Payer: Medicaid Other

## 2023-05-01 ENCOUNTER — Encounter (HOSPITAL_COMMUNITY): Payer: Medicaid Other

## 2023-05-04 ENCOUNTER — Encounter (HOSPITAL_COMMUNITY): Payer: Medicaid Other

## 2023-05-06 ENCOUNTER — Encounter (HOSPITAL_COMMUNITY): Payer: Medicaid Other

## 2023-05-06 ENCOUNTER — Ambulatory Visit (HOSPITAL_COMMUNITY): Payer: Medicaid Other

## 2023-05-06 DIAGNOSIS — M546 Pain in thoracic spine: Secondary | ICD-10-CM
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
## 2023-05-08 ENCOUNTER — Encounter (HOSPITAL_COMMUNITY): Payer: Medicaid Other

## 2023-05-13 ENCOUNTER — Ambulatory Visit (HOSPITAL_COMMUNITY): Payer: Medicaid Other | Attending: Pain Medicine

## 2023-05-13 ENCOUNTER — Encounter (HOSPITAL_COMMUNITY): Payer: Self-pay

## 2023-05-13 DIAGNOSIS — M546 Pain in thoracic spine: Secondary | ICD-10-CM | POA: Diagnosis present
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
## 2023-05-20 ENCOUNTER — Encounter (HOSPITAL_COMMUNITY): Payer: Self-pay

## 2023-05-20 ENCOUNTER — Ambulatory Visit (HOSPITAL_COMMUNITY): Payer: Medicaid Other

## 2023-05-20 DIAGNOSIS — M546 Pain in thoracic spine: Secondary | ICD-10-CM
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
## 2023-05-25 ENCOUNTER — Encounter (HOSPITAL_COMMUNITY): Payer: Self-pay

## 2023-05-25 ENCOUNTER — Ambulatory Visit (HOSPITAL_COMMUNITY)

## 2023-05-25 DIAGNOSIS — M546 Pain in thoracic spine: Secondary | ICD-10-CM | POA: Diagnosis not present
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
## 2023-05-26 ENCOUNTER — Encounter (HOSPITAL_COMMUNITY): Payer: Medicaid Other

## 2023-06-11 ENCOUNTER — Other Ambulatory Visit: Payer: Self-pay

## 2023-06-11 ENCOUNTER — Emergency Department (HOSPITAL_COMMUNITY)

## 2023-06-11 ENCOUNTER — Emergency Department (HOSPITAL_COMMUNITY)
Admission: EM | Admit: 2023-06-11 | Discharge: 2023-06-11 | Disposition: A | Discharge status: Home / Self Care | Attending: Emergency Medicine | Admitting: Emergency Medicine

## 2023-06-11 ENCOUNTER — Encounter (HOSPITAL_COMMUNITY): Payer: Self-pay

## 2023-06-11 DIAGNOSIS — R1012 Left upper quadrant pain: Secondary | ICD-10-CM | POA: Insufficient documentation

## 2023-06-11 DIAGNOSIS — S92511A Displaced fracture of proximal phalanx of right lesser toe(s), initial encounter for closed fracture: Secondary | ICD-10-CM | POA: Insufficient documentation

## 2023-06-11 DIAGNOSIS — R55 Syncope and collapse: Secondary | ICD-10-CM | POA: Insufficient documentation

## 2023-06-11 DIAGNOSIS — Z7982 Long term (current) use of aspirin: Secondary | ICD-10-CM | POA: Insufficient documentation

## 2023-06-11 DIAGNOSIS — W19XXXA Unspecified fall, initial encounter: Secondary | ICD-10-CM | POA: Diagnosis not present

## 2023-06-11 DIAGNOSIS — R1011 Right upper quadrant pain: Secondary | ICD-10-CM | POA: Diagnosis not present

## 2023-06-11 DIAGNOSIS — R1013 Epigastric pain: Secondary | ICD-10-CM | POA: Diagnosis not present

## 2023-06-11 DIAGNOSIS — Z79899 Other long term (current) drug therapy: Secondary | ICD-10-CM | POA: Insufficient documentation

## 2023-06-11 DIAGNOSIS — S99921A Unspecified injury of right foot, initial encounter: Secondary | ICD-10-CM | POA: Diagnosis present

## 2023-06-11 LAB — CBC WITH DIFFERENTIAL/PLATELET
Abs Immature Granulocytes: 0.01 10*3/uL (ref 0.00–0.07)
Basophils Absolute: 0.1 10*3/uL (ref 0.0–0.1)
Basophils Relative: 1 %
Eosinophils Absolute: 0.3 10*3/uL (ref 0.0–0.5)
Eosinophils Relative: 4 %
HCT: 44.4 % (ref 36.0–46.0)
Hemoglobin: 14.2 g/dL (ref 12.0–15.0)
Immature Granulocytes: 0 %
Lymphocytes Relative: 32 %
Lymphs Abs: 2.5 10*3/uL (ref 0.7–4.0)
MCH: 28.9 pg (ref 26.0–34.0)
MCHC: 32 g/dL (ref 30.0–36.0)
MCV: 90.4 fL (ref 80.0–100.0)
Monocytes Absolute: 0.9 10*3/uL (ref 0.1–1.0)
Monocytes Relative: 12 %
Neutro Abs: 3.9 10*3/uL (ref 1.7–7.7)
Neutrophils Relative %: 51 %
Platelets: 244 10*3/uL (ref 150–400)
RBC: 4.91 MIL/uL (ref 3.87–5.11)
RDW: 15.1 % (ref 11.5–15.5)
WBC: 7.7 10*3/uL (ref 4.0–10.5)
nRBC: 0 % (ref 0.0–0.2)

## 2023-06-11 LAB — COMPREHENSIVE METABOLIC PANEL WITH GFR
ALT: 28 U/L (ref 0–44)
AST: 36 U/L (ref 15–41)
Albumin: 4 g/dL (ref 3.5–5.0)
Alkaline Phosphatase: 66 U/L (ref 38–126)
Anion gap: 8 (ref 5–15)
BUN: 29 mg/dL — ABNORMAL HIGH (ref 8–23)
CO2: 24 mmol/L (ref 22–32)
Calcium: 9.3 mg/dL (ref 8.9–10.3)
Chloride: 104 mmol/L (ref 98–111)
Creatinine, Ser: 1.19 mg/dL — ABNORMAL HIGH (ref 0.44–1.00)
GFR, Estimated: 52 mL/min — ABNORMAL LOW (ref >60)
Glucose, Bld: 87 mg/dL (ref 70–99)
Potassium: 3.7 mmol/L (ref 3.5–5.1)
Sodium: 136 mmol/L (ref 135–145)
Total Bilirubin: 0.7 mg/dL (ref 0.0–1.2)
Total Protein: 7.6 g/dL (ref 6.5–8.1)

## 2023-06-11 LAB — LIPASE, BLOOD: Lipase: 68 U/L — ABNORMAL HIGH (ref 11–51)

## 2023-06-11 LAB — CBG MONITORING, ED: Glucose-Capillary: 81 mg/dL (ref 70–99)

## 2023-06-11 MED ORDER — ACETAMINOPHEN 500 MG PO TABS
1000.0000 mg | ORAL_TABLET | Freq: Once | ORAL | Status: AC
  Administered 2023-06-11: 1000 mg via ORAL
  Filled 2023-06-11: qty 2

## 2023-06-11 MED ORDER — IOHEXOL 300 MG/ML  SOLN
100.0000 mL | Freq: Once | INTRAMUSCULAR | Status: AC | PRN
  Administered 2023-06-11: 100 mL via INTRAVENOUS

## 2023-06-11 NOTE — Discharge Instructions (Addendum)
 The emergency room today for pain after passing out 2 days ago.   We did blood work which was all reassuring, your kidney function is around his usual baseline.  Make sure you are drinking plenty of water to stay hydrated.   Your EKG was normal, your blood counts are normal.  Your imaging showed no abnormalities of your brain or neck.  You do have stones in your gallbladder and can follow-up with your PCP, avoid fatty foods and if you start having abdominal pain in the right upper quadrant you may need to have the gallbladder removed, you can follow-up with general surgery if you develop the symptoms. You have a fracture of the fourth toe that is mildly displaced.  We have buddy tape this and have given you a hard soled shoe and crutches.  Follow-up with orthopedics.  He can take over-the-counter medication as directed on the packaging as needed for pain.  Back to ER for new or worsening symptoms.

## 2023-06-11 NOTE — ED Triage Notes (Signed)
 Pt arrived via POV c/o right 4th toe injury following a fall 2 days ago. Pt reports she felt dizzy and fell and lost consciousness.

## 2023-06-11 NOTE — ED Provider Notes (Signed)
 [facility] AT [facility] Provider Note   CSN: [id number] Arrival date & time: 06/11/23  1003     History  Chief Complaint  Patient presents with   Sarah Vargas is a 63 y.o. female.  She has PMH of CAD with STEMI on 12/02/2022 and patient said subsequently underwent PCI with stenting of first diagonal branch of LAD.  He is on Brilinta.  Presents ER today for evaluation of right fourth toe pain after fall.  Also having upper abdominal pain after this fall.  This occurred at approximately 4 AM on 06/09/2023.  Patient got out of bed to go to the bathroom, when she got to the bathroom started feeling lightheaded and went to get back to her bed after using the bathroom but states she was trying to adjust her and her and subsequently had a presyncopal episode, has had some soreness to the left side of her head and left neck so she believes she hit her head and also has bruising to the fourth toe on the right foot and pain.  Also has some bruising to the right elbow but states she has normal range of motion and minimal pain.  She is not have any dizziness since then, she was not have any chest pain or palpitations with this.   Fall       Home Medications Prior to Admission medications   Medication Sig Start Date End Date Taking? Authorizing Provider  alendronate (FOSAMAX) 70 MG tablet Take 70 mg by mouth once a week. Take on empty stomach with full glass of water, sit upright for 1 hour after. 10/06/22   [provider]  aspirin EC 81 MG tablet Take 1 tablet (81 mg total) by mouth daily. Swallow whole. 12/04/22   Perlie Gold, PA-C  baclofen (LIORESAL) 10 MG tablet Take 10 mg by mouth 3 (three) times daily.    [provider]  Calcium Carb-Cholecalciferol (CALCIUM 600 + D PO) Take 1 tablet by mouth in the morning and at bedtime.    [provider]  DULoxetine (CYMBALTA) 30 MG capsule Take 30 mg by mouth See admin instructions.  Take with Duloxetine 60mg  to equal 90mg  total. 02/08/18   [provider]  DULoxetine (CYMBALTA) 60 MG capsule Take 60 mg by mouth See admin instructions. Take with Duloxetine 30mg  to equal 90mg  total. 02/08/18   [provider]  loratadine (CLARITIN) 10 MG tablet Take 10 mg by mouth daily.    [provider]  metoprolol succinate (TOPROL-XL) 25 MG 24 hr tablet Take 0.5 tablets (12.5 mg total) by mouth daily. 02/26/23   Ronney Asters, NP  Multiple Vitamin (MULTIVITAMIN) tablet Take 1 tablet by mouth daily.    [provider]  nitroGLYCERIN (NITROSTAT) 0.4 MG SL tablet Place 1 tablet (0.4 mg total) under the tongue every 5 (five) minutes as needed for chest pain. 12/03/22 12/03/23  Perlie Gold, PA-C  pantoprazole (PROTONIX) 40 MG tablet Take 1 tablet (40 mg total) by mouth daily. 03/07/23   Azalee Course, PA  rosuvastatin (CRESTOR) 40 MG tablet Take 1 tablet (40 mg total) by mouth daily. 02/26/23   Ronney Asters, NP  ticagrelor (BRILINTA) 90 MG TABS tablet Take 1 tablet (90 mg total) by mouth 2 (two) times daily. 03/07/23   Azalee Course, PA  topiramate (TOPAMAX) 50 MG tablet Take 50 mg by mouth 2 (two) times daily.    [provider]  traZODone (DESYREL)  150 MG tablet Take 150 mg by mouth at bedtime.    [provider]      Allergies    Patient has no known allergies.    Review of Systems   Review of Systems  Physical Exam Updated Vital Signs BP 129/83   Pulse 73   Temp 97.9 F (36.6 C) (Oral)   Resp 19   Ht 5\' 4"  (1.626 m)   Wt 63.3 kg   SpO2 96%   BMI 23.95 kg/m  Physical Exam Vitals and nursing note reviewed.  Constitutional:      General: She is not in acute distress.    Appearance: She is well-developed.  HENT:     Head: Normocephalic and atraumatic.     Mouth/Throat:     Mouth: Mucous membranes are moist.  Eyes:     Conjunctiva/sclera: Conjunctivae normal.  Cardiovascular:     Rate and Rhythm: Normal rate and regular  rhythm.     Heart sounds: No murmur heard. Pulmonary:     Effort: Pulmonary effort is normal. No respiratory distress.     Breath sounds: Normal breath sounds.  Abdominal:     Palpations: Abdomen is soft.     Tenderness: There is abdominal tenderness in the right upper quadrant, epigastric area and left upper quadrant.  Musculoskeletal:        General: No swelling.     Cervical back: Neck supple. Tenderness present. No swelling, deformity or signs of trauma.     Thoracic back: Normal.     Lumbar back: Normal.     Comments: Bruising and tenderness to fourth toe on right foot.  DP and PT pulses intact left foot, mild bruising to the forefoot without significant tenderness.  Skin:    General: Skin is warm and dry.     Capillary Refill: Capillary refill takes less than 2 seconds.  Neurological:     General: No focal deficit present.     Mental Status: She is alert and oriented to person, place, and time.  Psychiatric:        Mood and Affect: Mood normal.     ED Results / Procedures / Treatments   Labs (all labs ordered are listed, but only abnormal results are displayed) Labs Reviewed  COMPREHENSIVE METABOLIC PANEL WITH GFR - Abnormal; Notable for the following components:      Result Value   BUN 29 (*)    Creatinine, Ser 1.19 (*)    GFR, Estimated 52 (*)    All other components within normal limits  LIPASE, BLOOD - Abnormal; Notable for the following components:   Lipase 68 (*)    All other components within normal limits  CBC WITH DIFFERENTIAL/PLATELET  CBG MONITORING, ED    EKG EKG Interpretation Date/Time:  Thursday June 11 2023 10:28:44 EDT Ventricular Rate:  75 PR Interval:  171 QRS Duration:  90 QT Interval:  362 QTC Calculation: 405 R Axis:   67  Text Interpretation: Sinus rhythm No significant change since prior 10/24 Confirmed by Meridee Score [phone]) on 06/11/2023 10:34:41 AM  Radiology CT ABDOMEN PELVIS W CONTRAST Result Date: 06/11/2023 CLINICAL DATA:   Abdominal pain, acute, nonlocalized.  Fall. EXAM: CT ABDOMEN AND PELVIS WITH CONTRAST TECHNIQUE: Multidetector CT imaging of the abdomen and pelvis was performed using the standard protocol following bolus administration of intravenous contrast. RADIATION DOSE REDUCTION: This exam was performed according to the departmental dose-optimization program which includes automated exposure control, adjustment of the mA and/or kV according to  patient size and/or use of iterative reconstruction technique. CONTRAST:  OMNIPAQUE IOHEXOL 300 MG/ML  SOLN COMPARISON:  None available. FINDINGS: Lower chest: No acute abnormality. Hepatobiliary: No suspicious focal hepatic lesion. Few calcified gallstones near the gallbladder neck. No gallbladder wall thickening or pericholecystic fluid. No biliary dilatation. Pancreas: Unremarkable. No pancreatic ductal dilatation or surrounding inflammatory changes. Spleen: Normal in size without focal abnormality. Adrenals/Urinary Tract: Adrenal glands are unremarkable. Kidneys are normal, without renal calculi, focal lesion, or hydronephrosis. Bladder is unremarkable. Stomach/Bowel: Tiny hiatal hernia. No evidence of obstruction or focal inflammatory changes. Appendix appears normal. Moderate-to-large colonic stool burden. Vascular/Lymphatic: Aortic atherosclerosis. No enlarged abdominal or pelvic lymph nodes. Reproductive: Status post hysterectomy. No adnexal masses. Other: No abdominal wall hernia or abnormality. No abdominopelvic ascites. No intraperitoneal free air. Musculoskeletal: Mild age-indeterminate anterior wedging deformities of the T11 and T12 vertebral bodies. No suspicious focal lesion. IMPRESSION: 1. Mild age-indeterminate anterior wedging deformities of the T11 and T12 vertebral bodies. 2. Cholelithiasis without evidence of acute cholecystitis. Electronically Signed   By: Hart Robinsons M.D.   On: 06/11/2023 13:50   CT Head Wo Contrast Result Date: 06/11/2023 CLINICAL  DATA:  Provided history: Head trauma, coagulopathy. Neck trauma, midline tenderness. Additional history provided: Fall two days ago. Dizziness. Loss of consciousness. EXAM: CT HEAD WITHOUT CONTRAST CT CERVICAL SPINE WITHOUT CONTRAST TECHNIQUE: Multidetector CT imaging of the head and cervical spine was performed following the standard protocol without intravenous contrast. Multiplanar CT image reconstructions of the cervical spine were also generated. RADIATION DOSE REDUCTION: This exam was performed according to the departmental dose-optimization program which includes automated exposure control, adjustment of the mA and/or kV according to patient size and/or use of iterative reconstruction technique. COMPARISON:  Report from cervical spine radiographs 07/26/2010 (images unavailable). FINDINGS: CT HEAD FINDINGS Brain: No age-advanced or lobar predominant cerebral atrophy. There is no acute intracranial hemorrhage. No demarcated cortical infarct. No extra-axial fluid collection. No evidence of an intracranial mass. No midline shift. Vascular: No hyperdense vessel.  Atherosclerotic calcifications. Skull: No calvarial fracture or aggressive osseous lesion. Sinuses/Orbits: No mass or acute finding within the imaged orbits. Mild mucosal thickening within the left sphenoid sinus. CT CERVICAL SPINE FINDINGS Alignment: Nonspecific straightening of the expected cervical lordosis. No significant spondylolisthesis. Skull base and vertebrae: The basion-dental and atlanto-dental intervals are maintained.No evidence of acute fracture to the cervical spine. Soft tissues and spinal canal: No prevertebral fluid or swelling. No visible canal hematoma. Disc levels: Cervical spondylosis. Disc space narrowing is greatest at C5-6 (mild-to-moderate this level). Multilevel disc bulges/central disc protrusions and uncovertebral hypertrophy. No appreciable high-grade spinal canal stenosis. Uncovertebral hypertrophy results in left-sided  bony neural foraminal narrowing at C5-C6. Degenerative changes also present at the C1-C2 articulation. Upper chest: No consolidation within the imaged lung apices. No visible pneumothorax. Other: Subcentimeter calcified and noncalcified thyroid nodules, not meeting consensus criteria for ultrasound follow-up based on size. No follow-up imaging recommended. Reference: J Am Coll Radiol. 2015 Feb;12(2): 143-50. IMPRESSION: CT head: 1.  No evidence of an acute intracranial abnormality. 2. Mild left sphenoid sinus mucosal thickening. CT cervical spine: 1. No evidence of an acute cervical spine fracture. 2. Nonspecific straightening of the expected cervical lordosis. 3. Cervical spondylosis as described. Electronically Signed   By: Jackey Loge D.O.   On: 06/11/2023 13:07   CT Cervical Spine Wo Contrast Result Date: 06/11/2023 CLINICAL DATA:  Provided history: Head trauma, coagulopathy. Neck trauma, midline tenderness. Additional history provided: Fall two days ago. Dizziness. Loss of  consciousness. EXAM: CT HEAD WITHOUT CONTRAST CT CERVICAL SPINE WITHOUT CONTRAST TECHNIQUE: Multidetector CT imaging of the head and cervical spine was performed following the standard protocol without intravenous contrast. Multiplanar CT image reconstructions of the cervical spine were also generated. RADIATION DOSE REDUCTION: This exam was performed according to the departmental dose-optimization program which includes automated exposure control, adjustment of the mA and/or kV according to patient size and/or use of iterative reconstruction technique. COMPARISON:  Report from cervical spine radiographs 07/26/2010 (images unavailable). FINDINGS: CT HEAD FINDINGS Brain: No age-advanced or lobar predominant cerebral atrophy. There is no acute intracranial hemorrhage. No demarcated cortical infarct. No extra-axial fluid collection. No evidence of an intracranial mass. No midline shift. Vascular: No hyperdense vessel.  Atherosclerotic  calcifications. Skull: No calvarial fracture or aggressive osseous lesion. Sinuses/Orbits: No mass or acute finding within the imaged orbits. Mild mucosal thickening within the left sphenoid sinus. CT CERVICAL SPINE FINDINGS Alignment: Nonspecific straightening of the expected cervical lordosis. No significant spondylolisthesis. Skull base and vertebrae: The basion-dental and atlanto-dental intervals are maintained.No evidence of acute fracture to the cervical spine. Soft tissues and spinal canal: No prevertebral fluid or swelling. No visible canal hematoma. Disc levels: Cervical spondylosis. Disc space narrowing is greatest at C5-6 (mild-to-moderate this level). Multilevel disc bulges/central disc protrusions and uncovertebral hypertrophy. No appreciable high-grade spinal canal stenosis. Uncovertebral hypertrophy results in left-sided bony neural foraminal narrowing at C5-C6. Degenerative changes also present at the C1-C2 articulation. Upper chest: No consolidation within the imaged lung apices. No visible pneumothorax. Other: Subcentimeter calcified and noncalcified thyroid nodules, not meeting consensus criteria for ultrasound follow-up based on size. No follow-up imaging recommended. Reference: J Am Coll Radiol. 2015 Feb;12(2): 143-50. IMPRESSION: CT head: 1.  No evidence of an acute intracranial abnormality. 2. Mild left sphenoid sinus mucosal thickening. CT cervical spine: 1. No evidence of an acute cervical spine fracture. 2. Nonspecific straightening of the expected cervical lordosis. 3. Cervical spondylosis as described. Electronically Signed   By: Jackey Loge D.O.   On: 06/11/2023 13:07   DG Foot Complete Right Result Date: 06/11/2023 CLINICAL DATA:  Right fourth toe pain after fall 2 days ago. EXAM: RIGHT FOOT COMPLETE - 3+ VIEW COMPARISON:  November 10, 2008. FINDINGS: Mildly displaced fourth proximal phalangeal fracture is noted. Joint spaces are unremarkable. No soft tissue abnormality. IMPRESSION:  Mildly displaced fourth proximal phalangeal fracture. Electronically Signed   By: Lupita Raider M.D.   On: 06/11/2023 12:40    Procedures Procedures    Medications Ordered in ED Medications  iohexol (OMNIPAQUE) 300 MG/ML solution 100 mL (100 mLs Intravenous Contrast Given 06/11/23 1234)  acetaminophen (TYLENOL) tablet 1,000 mg (1,000 mg Oral Given 06/11/23 1419)    ED Course/ Medical Decision Making/ A&P                                 Medical Decision Making This patient presents to the ED for concern of syncope, head injury, left fourth toe pain, this involves an extensive number of treatment options, and is a complaint that carries with it a high risk of complications and morbidity.  The differential diagnosis includes for static hypotension, vasovagal syncope, arrhythmia, electrolyte derangement, seizure, other; fracture, sprain, strain, contusion, other   Co morbidities that complicate the patient evaluation :   CAD, on Brilinta from recent stent   Additional history obtained:  Additional history obtained from EMR External records from outside source obtained and  reviewed including prior notes and labs    Imaging Studies ordered:  I ordered imaging studies including CT head and C-spine which shows no acute intracranial abnormality, no C-spine fracture or traumatic malalignment; CT abdomen pelvis shows small gallstones but no signs of cholecystitis, no other intra-abdominal process, age-indeterminate T-spine fractures; x-ray right foot shows mildly displaced fourth proximal phalanx fracture I independently visualized and interpreted imaging within scope of identifying emergent findings  I agree with the radiologist interpretation   Cardiac Monitoring: / EKG:  The patient was maintained on a cardiac monitor.  I personally viewed and interpreted the cardiac monitored which showed an underlying rhythm of: sinus rhythm     Problem List / ED Course / Critical interventions /  Medication management  Syncope-patient experienced lightheadedness when getting up abruptly from bed and going to the bathroom at 4 AM, had a brief syncopal episode.  She is not having palpitations or chest pain, labs are reassuring, this happened 2 days ago.  Do not feel she needs further workup, likely due to position change.  Advised to maintain adequate oral hydration and follow close with her PCP and given strict return precautions.  I do not feel this is related to an arrhythmia, not consistent with seizure presentation, no tachycardia, tachypnea or hypoxia to suggest PE and patient has no chest pain or shortness of breath.  Upper abdominal tenderness and pain with movement-likely from her fall.  CT abdomen pelvis ordered to rule out injury or other acute process she has some incidental gallstones but negative Murphy sign, no signs of cholecystitis on exam, LFTs are normal, lipase slightly elevated but has been elevated in the past and nonspecific.  Patient advised on incidental findings follow-up and return precautions.  Right fourth toe pain-x-ray shows fracture on x-ray, buddy taped by me and patient put in hard soled shoe, given crutches and advised on follow-up with orthopedics.  Regarding patient's incidental T-spine age-indeterminate compression deformities, discussed these with patient, given no back tenderness today feel this are remote, advised on PCP follow-up.    I ordered medication including Tylenol for pain Reevaluation of the patient after these medicines showed that the patient improved I have reviewed the patients home medicines and have made adjustments as needed       Amount and/or Complexity of Data Reviewed External Data Reviewed: labs and notes. Labs: ordered. Decision-making details documented in ED Course. Radiology: ordered. Decision-making details documented in ED Course.  Risk OTC drugs. Prescription drug management.           Final Clinical  Impression(s) / ED Diagnoses Final diagnoses:  Syncope and collapse  Closed displaced fracture of proximal phalanx of lesser toe of right foot, initial encounter    Rx / DC Orders ED Discharge Orders     None         Ma Rings, PA-C 06/11/23 1443    Terrilee Files, MD 06/11/23 1734

## 2023-06-11 NOTE — ED Notes (Signed)
 X-ray at bedside

## 2023-06-17 ENCOUNTER — Ambulatory Visit: Admitting: Orthopedic Surgery

## 2023-06-17 ENCOUNTER — Encounter: Payer: Self-pay | Admitting: Orthopedic Surgery

## 2023-06-17 VITALS — BP 127/87 | HR 67 | Ht 64.0 in | Wt 145.0 lb

## 2023-06-17 DIAGNOSIS — S92511A Displaced fracture of proximal phalanx of right lesser toe(s), initial encounter for closed fracture: Secondary | ICD-10-CM

## 2023-06-17 NOTE — Progress Notes (Signed)
 New Patient Visit  Assessment: Sarah Vargas is a 63 y.o. female with the following: 1. Closed displaced fracture of proximal phalanx of lesser toe of right foot, initial encounter  Plan: Babs Sciara has a syncopal episode, fell and sustained a right fourth toe, proximal phalanx fracture.  Minimal displacement overall.  Continue with nonoperative management.  Continue buddy taping, and using the postop shoe.  Weightbearing as tolerated through the heel.  No weightbearing through the forefoot.  Follow-up in 3 weeks.  Follow-up: Return in about 3 weeks (around 07/08/2023).  Subjective:  Chief Complaint  Patient presents with   Fracture    R foot after injury DOI 06/09/23    History of Present Illness: Sarah Vargas is a 63 y.o. female who presents for evaluation of right foot pain.  She states that she had a syncopal episode a little over a week ago.  She lost consciousness and fell.  She had pain in her right foot.  She needed transportation, and had to wait for couple days before being evaluated in the emergency department.  She was noted to have a lesser toe fracture.  She is placed in a postop shoe.  She has been taking Tylenol for pain.  She continues with buddy taping of the toes.   Review of Systems: No fevers or chills No numbness or tingling No chest pain No shortness of breath No bowel or bladder dysfunction No GI distress No headaches   Medical History:  Past Medical History:  Diagnosis Date   Back pain    Chest pain    12/2010.   Fibromyalgia    Generalized headaches    GERD (gastroesophageal reflux disease) 12/10/2010   HTN (hypertension) 12/10/2010   Kidney stones    s/p ureteral stents-removed in past.   LBP (low back pain) 12/10/2010   Osteoarthritis    Positive H. pylori test 12/10/2010   Treated 2011.   STEMI (ST elevation myocardial infarction) (HCC) 12/02/2022   Tobacco abuse 12/10/2010    Past Surgical History:  Procedure Laterality Date    CORONARY/GRAFT ACUTE MI REVASCULARIZATION N/A 12/02/2022   Procedure: Coronary/Graft Acute MI Revascularization;  Surgeon: Tonny Bollman, MD;  Location: [facility] INVASIVE CV LAB;  Service: Cardiovascular;  Laterality: N/A;   LEFT HEART CATH AND CORONARY ANGIOGRAPHY N/A 12/02/2022   Procedure: LEFT HEART CATH AND CORONARY ANGIOGRAPHY;  Surgeon: Tonny Bollman, MD;  Location: [facility] INVASIVE CV LAB;  Service: Cardiovascular;  Laterality: N/A;   SALPINGOOPHORECTOMY     For hx of tubal pregnancy   TUBAL LIGATION     URETERAL STENT PLACEMENT      Family History  Problem Relation Age of Onset   Heart attack Father    Hypertension Father    Heart attack Mother    Hypertension Mother    Social History   Tobacco Use   Smoking status: Former    Current packs/day: 0.00    Average packs/day: 0.5 packs/day for 30.0 years (15.0 ttl pk-yrs)    Types: Cigarettes    Quit date: 12/02/2022    Years since quitting: 0.5   Smokeless tobacco: Never   Tobacco comments:    Quit 12/02/22  Vaping Use   Vaping status: Every Day   Substances: Nicotine, Flavoring  Substance Use Topics   Alcohol use: Yes    Comment: occas   Drug use: No    No Known Allergies  Current Meds  Medication Sig   alendronate (FOSAMAX) 70 MG tablet Take 70 mg  by mouth once a week. Take on empty stomach with full glass of water, sit upright for 1 hour after.   aspirin EC 81 MG tablet Take 1 tablet (81 mg total) by mouth daily. Swallow whole.   baclofen (LIORESAL) 10 MG tablet Take 10 mg by mouth 3 (three) times daily.   Calcium Carb-Cholecalciferol (CALCIUM 600 + D PO) Take 1 tablet by mouth in the morning and at bedtime.   DULoxetine (CYMBALTA) 30 MG capsule Take 30 mg by mouth See admin instructions. Take with Duloxetine 60mg  to equal 90mg  total.   DULoxetine (CYMBALTA) 60 MG capsule Take 60 mg by mouth See admin instructions. Take with Duloxetine 30mg  to equal 90mg  total.   loratadine (CLARITIN) 10 MG tablet Take 10 mg by mouth  daily.   metoprolol succinate (TOPROL-XL) 25 MG 24 hr tablet Take 0.5 tablets (12.5 mg total) by mouth daily.   Multiple Vitamin (MULTIVITAMIN) tablet Take 1 tablet by mouth daily.   nitroGLYCERIN (NITROSTAT) 0.4 MG SL tablet Place 1 tablet (0.4 mg total) under the tongue every 5 (five) minutes as needed for chest pain.   pantoprazole (PROTONIX) 40 MG tablet Take 1 tablet (40 mg total) by mouth daily.   rosuvastatin (CRESTOR) 40 MG tablet Take 1 tablet (40 mg total) by mouth daily.   ticagrelor (BRILINTA) 90 MG TABS tablet Take 1 tablet (90 mg total) by mouth 2 (two) times daily.   topiramate (TOPAMAX) 50 MG tablet Take 50 mg by mouth 2 (two) times daily.   traZODone (DESYREL) 150 MG tablet Take 150 mg by mouth at bedtime.    Objective: BP 127/87   Pulse 67   Ht 5\' 4"  (1.626 m)   Wt 145 lb (65.8 kg)   BMI 24.89 kg/m   Physical Exam:  General: Alert and oriented. and No acute distress. Gait: Right sided antalgic gait.  Wearing a postop shoe.   Evaluation of the right foot demonstrates bruising and swelling to the fourth toe.  Tenderness to palpation.  Toes warm and well-perfused.  No injuries elsewhere.    IMAGING: I personally reviewed images previously obtained from the ED  Minimally displaced oblique fracture of the proximal phalanx of the right fourth toe.   New Medications:  No orders of the defined types were placed in this encounter.     Oliver Barre, MD  06/17/2023 10:41 AM

## 2023-07-01 DIAGNOSIS — S92511A Displaced fracture of proximal phalanx of right lesser toe(s), initial encounter for closed fracture: Secondary | ICD-10-CM | POA: Insufficient documentation

## 2023-07-08 ENCOUNTER — Ambulatory Visit (INDEPENDENT_AMBULATORY_CARE_PROVIDER_SITE_OTHER): Admitting: Orthopedic Surgery

## 2023-07-08 ENCOUNTER — Other Ambulatory Visit (INDEPENDENT_AMBULATORY_CARE_PROVIDER_SITE_OTHER): Payer: Self-pay

## 2023-07-08 ENCOUNTER — Encounter: Payer: Self-pay | Admitting: Orthopedic Surgery

## 2023-07-08 DIAGNOSIS — S92511D Displaced fracture of proximal phalanx of right lesser toe(s), subsequent encounter for fracture with routine healing: Secondary | ICD-10-CM

## 2023-07-08 DIAGNOSIS — S92511A Displaced fracture of proximal phalanx of right lesser toe(s), initial encounter for closed fracture: Secondary | ICD-10-CM | POA: Diagnosis not present

## 2023-07-08 NOTE — Patient Instructions (Signed)
 Continue buddy taping your toes and wearing the shoe for the next 2 weeks.  In 2 weeks, you can start to transition to a regular shoe  Follow up in 1 month  Elevate to help with swelling and pain  Medications as needed.

## 2023-07-08 NOTE — Progress Notes (Signed)
 Return patient Visit  Assessment: Sarah Vargas is a 63 y.o. female with the following: 1. Closed displaced fracture of proximal phalanx of lesser toe of right foot, subsequent encounter  Plan: Sarah Vargas sustained a right fourth toe, proximal phalanx fracture, approximately 1 month ago.  She has tolerated the postop shoe.  She continues to buddy tape.  She is doing well overall.  Radiographs are stable.  Continue with buddy taping in the postop shoe for the next 2 weeks.  After that, she can transition to a regular shoe.  Follow-up in 1 month.  Follow-up: Return in about 4 weeks (around 08/05/2023).  Subjective:  Chief Complaint  Patient presents with   Foot Injury    Toe Right improved some, using buddy tape and post op shoe     History of Present Illness: Sarah Vargas is a 63 y.o. female who returns for evaluation of right foot pain.  She injured lesser toe, approximately a month ago.  She continues take Tylenol .  She has been using a postop shoe.  She continues to buddy tape her 3rd and 4th toes.  Pain is better.  Swelling is better.  Review of Systems: No fevers or chills No numbness or tingling No chest pain No shortness of breath No bowel or bladder dysfunction No GI distress No headaches   Objective: There were no vitals taken for this visit.  Physical Exam:  General: Alert and oriented. and No acute distress. Gait: Right sided antalgic gait.  Wearing a postop shoe.   Right foot with minimal swelling.  No bruising.  Mild tenderness to palpation over the proximal phalanx of the fourth toe.  No skin breakdown.  Toes warm and well-perfused.    IMAGING: I personally ordered and reviewed the following images  X-rays of the right foot were obtained in clinic today.  These are compared to prior x-rays.  There is an oblique fracture through the proximal phalanx of the fourth toe.  No obvious intra-articular involvement.  No interval displacement.  No bony  lesions.   Impression: Healing right proximal phalanx fracture in stable alignment  New Medications:  No orders of the defined types were placed in this encounter.     Tonita Frater, MD  07/08/2023 1:32 PM

## 2023-07-13 ENCOUNTER — Telehealth: Payer: Self-pay | Admitting: *Deleted

## 2023-07-13 NOTE — Telephone Encounter (Signed)
 Sarah Vargas,  Request for urgent cholecystectomy with Brilinta  hold. Do you agree with plan to hold Brilinta  for 5 days and continue aspirin ?   We will contact her to ensure no current cardiac symptoms that warrant further testing.  Please route your response to p cv div preop.  Thanks, Moira Andrews

## 2023-07-13 NOTE — Telephone Encounter (Signed)
   Pre-operative Risk Assessment    Patient Name: Sarah Vargas  DOB: 28-Apr-1960 MRN: [id number]   Date of last office visit: 03/26/23 DR. COOPER Date of next office visit: NONE   Request for Surgical Clearance    Procedure:   ROBOTIC CHOLECYSTECTOMY  Date of Surgery:  Clearance TBD (PER REQUEST URGENT)                               Surgeon:  DR. Sherleen Dirks Surgeon's Group or Practice Name:  [facility]  Phone number:  [phone] Fax number:  [phone]   Type of Clearance Requested:   - Medical  - Pharmacy:  Hold Ticagrelor  (Brilinta )     Type of Anesthesia:  General    Additional requests/questions:    [name]   07/13/2023, 10:13 AM

## 2023-07-14 NOTE — Telephone Encounter (Signed)
   Primary Cardiologist: Arnoldo Lapping, MD  Called patient to evaluate for cardiac risk for upcoming surgery. Patient had recent syncopal episode on 06/11/23 and went to ED for evaluation. Reports this occurred during the night after getting up to void. She reports she thought she would make it back to the bed but passed out before reaching her bed. She is unsure if she lost consciousness but was aware she had struck her head. No further episodes and no chest pain or palpitations prior to event. No acute findings in ED.   She is able to achieve > 4 METS activity without concerning cardiac symptoms with regular walking which includes going up and down hills.   Chart reviewed as part of pre-operative protocol coverage. Given past medical history and time since last visit, based on ACC/AHA guidelines, Aneisa L Garelick would be at acceptable risk for the planned procedure without further cardiovascular testing.   Patient was advised that if she develops new symptoms prior to surgery to contact our office to arrange a follow-up appointment.  He verbalized understanding.  Per office protocol, she may hold Brilinta  for 5 days prior to procedure and should resume as soon as hemodynamically stable postoperatively. Due to cardiac stent placed 11/2022, aspirin  should be continued during the perioperative period.  I will route this recommendation to the requesting party via Epic fax function and remove from pre-op pool.  Please call with questions.  Gerldine Koch, NP-C  07/14/2023, 1:01 PM [address], [address] [place], [place] [address] Office [phone] Fax [phone]

## 2023-07-14 NOTE — Telephone Encounter (Signed)
 Yes if surgery is urgent, this is appropriate. Hold brilinta  x 5 days, continue ASA if possible, and resume brilinta  when safe from post-op perspective. thanks

## 2023-08-04 ENCOUNTER — Encounter: Admitting: Orthopedic Surgery

## 2023-08-05 ENCOUNTER — Encounter: Admitting: Orthopedic Surgery

## 2023-08-11 ENCOUNTER — Encounter: Admitting: Orthopedic Surgery

## 2023-08-18 ENCOUNTER — Encounter: Payer: Self-pay | Admitting: Orthopedic Surgery

## 2023-08-18 ENCOUNTER — Ambulatory Visit (INDEPENDENT_AMBULATORY_CARE_PROVIDER_SITE_OTHER): Admitting: Orthopedic Surgery

## 2023-08-18 ENCOUNTER — Other Ambulatory Visit (INDEPENDENT_AMBULATORY_CARE_PROVIDER_SITE_OTHER): Payer: Self-pay

## 2023-08-18 DIAGNOSIS — S92511D Displaced fracture of proximal phalanx of right lesser toe(s), subsequent encounter for fracture with routine healing: Secondary | ICD-10-CM

## 2023-08-18 NOTE — Progress Notes (Signed)
 Return patient Visit  Assessment: Sarah Vargas is a 63 y.o. female with the following: 1. Closed displaced fracture of proximal phalanx of lesser toe of right foot, subsequent encounter  Plan: Solveig L Symanski sustained a right fourth toe, proximal phalanx fracture, approximately 2 months ago.  She is now in a regular shoe.  Minimal pain.  She is doing quite well overall.  She does note swelling occasionally.  This should continue to improve.  Nothing further needed at this time.  She will return to clinic as needed.  Follow-up: Return if symptoms worsen or fail to improve.  Subjective:  Chief Complaint  Patient presents with   Follow-up    Recheck on right foot    History of Present Illness: Sarah Vargas is a 63 y.o. female who returns for evaluation of right foot pain.  She injured lesser toe, approximately 2 months ago.  Medications as needed.  She is now in a regular shoe.  She does have periodic swelling.  She does have some residual swelling.  review of Systems: No fevers or chills No numbness or tingling No chest pain No shortness of breath No bowel or bladder dysfunction No GI distress No headaches   Objective: There were no vitals taken for this visit.  Physical Exam:  General: Alert and oriented. and No acute distress. Gait: Normal gait.  Into regular shoe.   Right foot with mild swelling to the fourth toe.  Minimal tenderness.  Toes warm and well-perfused.  No bruising.  Sensation intact to the dorsum of the foot.   IMAGING: I personally ordered and reviewed the following images  X-rays the right foot were obtained in clinic today.  These are compared to available x-rays.  Oblique fractures through the proximal phalanx of the fourth toe remains in stable alignment.  There has been interval consolidation.  No further displacement.  No bony lesions.  No dislocations.  Impression: Stable right fourth toe proximal phalanx fracture  New Medications:  No  orders of the defined types were placed in this encounter.     Tonita Frater, MD  08/18/2023 2:58 PM

## 2023-09-03 ENCOUNTER — Telehealth: Payer: Self-pay | Admitting: Cardiovascular Disease

## 2023-09-03 MED ORDER — PANTOPRAZOLE SODIUM 40 MG PO TBEC: 40.0000 mg | DELAYED_RELEASE_TABLET | Freq: Every day | ORAL | 1 refills | Status: DC

## 2023-09-03 NOTE — Telephone Encounter (Signed)
 Pt's medication was sent to pt's pharmacy as requested. Confirmation received.

## 2023-09-03 NOTE — Telephone Encounter (Signed)
*  STAT* If patient is at the pharmacy, call can be transferred to refill team.   1. Which medications need to be refilled? (please list name of each medication and dose if known)   pantoprazole  (PROTONIX ) 40 MG tablet     4. Which pharmacy/location (including street and city if local pharmacy) is medication to be sent to?  [facility] [address] [place] - [address] Phone: [phone]  Fax: [phone]       5. Do they need a 30 day or 90 day supply? 90

## 2023-10-15 ENCOUNTER — Other Ambulatory Visit: Payer: Self-pay | Admitting: Medical Genetics

## 2023-11-04 ENCOUNTER — Other Ambulatory Visit (HOSPITAL_COMMUNITY): Payer: Self-pay

## 2023-11-16 ENCOUNTER — Telehealth: Payer: Self-pay | Admitting: Cardiovascular Disease

## 2023-11-16 MED ORDER — METOPROLOL SUCCINATE ER 25 MG PO TB24: 12.5000 mg | ORAL_TABLET | Freq: Every day | ORAL | 1 refills | Status: DC

## 2023-11-16 NOTE — Telephone Encounter (Signed)
*  STAT* If patient is at the pharmacy, call can be transferred to refill team.   1. Which medications need to be refilled? (please list name of each medication and dose if known)   metoprolol  succinate (TOPROL -XL) 25 MG 24 hr tablet    4. Which pharmacy/location (including street and city if local pharmacy) is medication to be sent to?  [facility] [place], [place] - [address]     5. Do they need a 30 day or 90 day supply? 90

## 2023-11-16 NOTE — Telephone Encounter (Signed)
 Pt's medication was sent to pt's pharmacy as requested. Confirmation received.

## 2023-11-18 ENCOUNTER — Telehealth: Payer: Self-pay | Admitting: Cardiovascular Disease

## 2023-11-18 MED ORDER — METOPROLOL SUCCINATE ER 25 MG PO TB24: 12.5000 mg | ORAL_TABLET | Freq: Every day | ORAL | 1 refills | Status: AC

## 2023-11-18 NOTE — Telephone Encounter (Signed)
*  STAT* If patient is at the pharmacy, call can be transferred to refill team.   1. Which medications need to be refilled? (please list name of each medication and dose if known) metoprolol  succinate (TOPROL -XL) 25 MG 24 hr tablet    2. Would you like to learn more about the convenience, safety, & potential cost savings by using the [facility]? No   3. Are you open to using the [facility] ([facility]. No   4. Which pharmacy/location (including street and city if local pharmacy) is medication to be sent to? [facility] - [place], [place] - [address]    5. Do they need a 30 day or 90 day supply? 30 day   Rx originally sent to wrong pharmacy. Correct pharmacy is [facility] - [place], [place] - [address] .

## 2023-11-18 NOTE — Telephone Encounter (Signed)
 Pt's medication was resent to pt's pharmacy as requested. Confirmation received.

## 2023-12-03 ENCOUNTER — Telehealth: Payer: Self-pay | Admitting: General Practice

## 2023-12-03 MED ORDER — ROSUVASTATIN CALCIUM 40 MG PO TABS: 40.0000 mg | ORAL_TABLET | Freq: Every day | ORAL | 0 refills | Status: DC

## 2023-12-03 NOTE — Telephone Encounter (Signed)
*  STAT* If patient is at the pharmacy, call can be transferred to refill team.   1. Which medications need to be refilled? (please list name of each medication and dose if known) rosuvastatin  (CRESTOR ) 40 MG tablet    2. Would you like to learn more about the convenience, safety, & potential cost savings by using the [facility]? No   3. Are you open to using the [facility] ([facility] No   4. Which pharmacy/location (including street and city if local pharmacy) is medication to be sent to?  [facility] - [place], [place] - [address]   5. Do they need a 30 day or 90 day supply? 90 day

## 2023-12-03 NOTE — Telephone Encounter (Signed)
 Pt's medication was sent to pt's pharmacy as requested. Confirmation received.

## 2023-12-11 ENCOUNTER — Other Ambulatory Visit: Payer: Self-pay | Admitting: Medical Genetics

## 2023-12-11 DIAGNOSIS — Z006 Encounter for examination for normal comparison and control in clinical research program: Secondary | ICD-10-CM

## 2024-01-07 ENCOUNTER — Encounter: Payer: Self-pay | Admitting: *Deleted

## 2024-01-11 ENCOUNTER — Ambulatory Visit: Admitting: Physician Assistant

## 2024-01-11 ENCOUNTER — Encounter: Payer: Self-pay | Admitting: Physician Assistant

## 2024-01-11 DIAGNOSIS — I2510 Atherosclerotic heart disease of native coronary artery without angina pectoris: Secondary | ICD-10-CM | POA: Insufficient documentation

## 2024-01-11 NOTE — Progress Notes (Deleted)
    OFFICE NOTE:    Date:  01/11/2024  ID:  Sarah Vargas, DOB 1961/02/11, MRN [id number] PCP: Baird Comer GAILS, NP  [facility] Providers Cardiologist:  Ozell Fell, MD { Click to update primary MD,subspecialty MD or APP then REFRESH:1}       Coronary artery disease  Lat STEMI in 11/2022 s/p 2.25 x 16 mm DES to the D1 LHC 12/02/2022: LAD proximal 50, D1 50, 100 thrombotic (PCI), RI 40, LCx irregularities, RCA irregularities TTE 12/03/2022: EF 55-60, no RWMA, normal RVSF, normal PASP, RVSP 29.7, trivial MR Hypertension  Hyperlipidemia         Discussed the use of AI scribe software for clinical note transcription with the patient, who gave verbal consent to proceed. History of Present Illness Sarah Vargas is a 63 y.o. female for follow up of coronary artery disease. Last seen by Dr. Fell in 03/2023.     ROS-See HPI***    Studies Reviewed:       Labs 04/20/2023: Total cholesterol 152, HDL 73, triglycerides 117, LDL 59  06/11/2023: K 3.7, creatinine 1.19, eGFR 52, ALT 28, Hgb 14.2, PLT 244K Results  Risk Assessment/Calculations: {Does this patient have ATRIAL FIBRILLATION?:[id number]} No BP recorded.  {Refresh Note OR Click here to enter BP  :1}***      Physical Exam:  VS:  There were no vitals taken for this visit.       Wt Readings from Last 3 Encounters:  06/17/23 145 lb (65.8 kg)  06/11/23 139 lb 8.8 oz (63.3 kg)  04/06/23 139 lb 8.8 oz (63.3 kg)    Physical Exam***     Assessment and Plan:    Assessment & Plan Coronary artery disease involving native coronary artery of native heart without angina pectoris Lateral STEMI in September 2024.  She was treated with DES to the diagonal.  She had moderate residual disease in the LAD which is managed medically.*** Pure hypercholesterolemia  Primary hypertension  Assessment and Plan Assessment & Plan    {      :1}    {Are you ordering a CV Procedure (e.g. stress test, cath, DCCV, TEE, etc)?    Press F2        :[id number]}  Dispo:  No follow-ups on file.  Signed, Glendia Ferrier, PA-C

## 2024-01-11 NOTE — Assessment & Plan Note (Deleted)
 Lateral STEMI in September 2024.  She was treated with DES to the diagonal.  She had moderate residual disease in the LAD which is managed medically.***

## 2024-01-13 LAB — GENECONNECT MOLECULAR SCREEN: Genetic Analysis Overall Interpretation: NEGATIVE

## 2024-01-14 ENCOUNTER — Telehealth: Payer: Self-pay | Admitting: Cardiovascular Disease

## 2024-01-14 ENCOUNTER — Other Ambulatory Visit (HOSPITAL_COMMUNITY): Payer: Self-pay

## 2024-01-14 MED ORDER — NITROGLYCERIN 0.4 MG SL SUBL: 0.4000 mg | SUBLINGUAL_TABLET | SUBLINGUAL | 0 refills | Status: AC | PRN

## 2024-01-14 NOTE — Telephone Encounter (Signed)
 Pt scheduled to see Glendia Ferrier, PA-C, 01/25/24.  Refill of Nitroglycerin  has been send to [facility].

## 2024-01-14 NOTE — Telephone Encounter (Signed)
*  STAT* If patient is at the pharmacy, call can be transferred to refill team.   1. Which medications need to be refilled? (please list name of each medication and dose if known)   nitroGLYCERIN  (NITROSTAT ) 0.4 MG SL tablet     2. Would you like to learn more about the convenience, safety, & potential cost savings by using the [facility]? No      3. Are you open to using the [facility] ([facility]. ). No    4. Which pharmacy/location (including street and city if local pharmacy) is medication to be sent to? [facility] - [place], [place] - [address]    5. Do they need a 30 day or 90 day supply? 1 bottle

## 2024-01-21 ENCOUNTER — Other Ambulatory Visit: Payer: Self-pay | Admitting: Nephrology

## 2024-01-22 ENCOUNTER — Encounter: Payer: Self-pay | Admitting: *Deleted

## 2024-01-25 ENCOUNTER — Encounter: Payer: Self-pay | Admitting: Physician Assistant

## 2024-01-25 ENCOUNTER — Ambulatory Visit: Attending: Physician Assistant | Admitting: Physician Assistant

## 2024-01-25 VITALS — BP 120/78 | HR 68 | Ht 64.0 in | Wt 150.8 lb

## 2024-01-25 DIAGNOSIS — I10 Essential (primary) hypertension: Secondary | ICD-10-CM | POA: Diagnosis not present

## 2024-01-25 DIAGNOSIS — I2510 Atherosclerotic heart disease of native coronary artery without angina pectoris: Secondary | ICD-10-CM | POA: Insufficient documentation

## 2024-01-25 DIAGNOSIS — E7800 Pure hypercholesterolemia, unspecified: Secondary | ICD-10-CM | POA: Insufficient documentation

## 2024-01-25 NOTE — Assessment & Plan Note (Signed)
 LDL levels are optimal, with recent LDL at 59 mg/dL and HDL at 73 mg/dL.  - Continue rosuvastatin  40 mg daily.

## 2024-01-25 NOTE — Patient Instructions (Signed)
 Medication Instructions:  STOP taking Ticagrelor  (Brilinta ).  *If you need a refill on your cardiac medications before your next appointment, please call your pharmacy*  Lab Work: None ordered If you have labs (blood work) drawn today and your tests are completely normal, you will receive your results only by: MyChart Message (if you have MyChart) OR A paper copy in the mail If you have any lab test that is abnormal or we need to change your treatment, we will call you to review the results.  Testing/Procedures: None ordered  Follow-Up: At [facility], you and your health needs are our priority.  As part of our continuing mission to provide you with exceptional heart care, our providers are all part of one team.  This team includes your primary Cardiologist (physician) and Advanced Practice Providers or APPs (Physician Assistants and Nurse Practitioners) who all work together to provide you with the care you need, when you need it.  Your next appointment:   1 year(s)  Provider:   Glendia Ferrier, PA-C          We recommend signing up for the patient portal called MyChart.  Sign up information is provided on this After Visit Summary.  MyChart is used to connect with patients for Virtual Visits (Telemedicine).  Patients are able to view lab/test results, encounter notes, upcoming appointments, etc.  Non-urgent messages can be sent to your provider as well.   To learn more about what you can do with MyChart, go to [email or web address].

## 2024-01-25 NOTE — Progress Notes (Signed)
    OFFICE NOTE:    Date:  01/25/2024  ID:  Sarah Vargas, DOB March 16, 1960, MRN [id number] PCP: Baird Comer GAILS, NP  [facility] Providers Cardiologist:  Ozell Fell, MD        Coronary artery disease  Lat STEMI in 11/2022 s/p 2.25 x 16 mm DES to the D1 LHC 12/02/2022: LAD proximal 50, D1 50, 100 thrombotic (PCI), RI 40, LCx irregularities, RCA irregularities TTE 12/03/2022: EF 55-60, no RWMA, normal RVSF, normal PASP, RVSP 29.7, trivial MR Hypertension  Hyperlipidemia         Discussed the use of AI scribe software for clinical note transcription with the patient, who gave verbal consent to proceed. History of Present Illness Sarah Vargas is a 63 y.o. female for follow up of coronary artery disease. Last seen by Dr. Fell in 03/2023. Plan at that time was to DC Ticagrelor  once she is 1 year out from her MI and remain on ASA monotherapy.   She reports no new chest pain, but continues to experience brief episodes of shortness of breath after bending over, which is very brief and has been present since before her heart attack without worsening. No swelling in her legs, passing out, or shortness of breath when lying flat. She also reports no issues with exercise-induced shortness of breath.    ROS-See HPI     Studies Reviewed:       Labs 04/20/2023: Total cholesterol 152, HDL 73, triglycerides 117, LDL 59  06/11/2023: K 3.7, creatinine 1.19, eGFR 52, ALT 28, Hgb 14.2, PLT 244K         Physical Exam:  VS:  BP 120/78   Pulse 68   Ht 5' 4 (1.626 m)   Wt 150 lb 12.8 oz (68.4 kg)   SpO2 97%   BMI 25.88 kg/m        Wt Readings from Last 3 Encounters:  01/25/24 150 lb 12.8 oz (68.4 kg)  06/17/23 145 lb (65.8 kg)  06/11/23 139 lb 8.8 oz (63.3 kg)    Constitutional:      Appearance: Healthy appearance. Not in distress.  Neck:     Vascular: JVD normal.  Pulmonary:     Breath sounds: Normal breath sounds. No wheezing. No rales.  Cardiovascular:     Normal  rate. Regular rhythm.     Murmurs: There is no murmur.  Edema:    Peripheral edema absent.  Abdominal:     Palpations: Abdomen is soft.       Assessment and Plan:    Assessment & Plan Coronary artery disease involving native coronary artery of native heart without angina pectoris Lateral STEMI in September 2024.  She was treated with DES to the diagonal.  She had moderate residual disease in the LAD which is managed medically.  She is doing well without chest symptoms to suggest angina.  She is more than a year post-infarct. Brilinta  can be discontinued. - Discontinue Brilinta   - Continue aspirin  81 mg daily. - Continue rosuvastatin  40 mg daily. - Follow-up 1 year Primary hypertension Blood pressure controlled. - Continue metoprolol  12.5 mg daily. Pure hypercholesterolemia LDL levels are optimal, with recent LDL at 59 mg/dL and HDL at 73 mg/dL.  - Continue rosuvastatin  40 mg daily.         Dispo:  Return in about 1 year (around 01/24/2025) for Routine Follow Up, w/ Dr. Fell, or Glendia Ferrier, PA-C.  Signed, Glendia Ferrier, PA-C

## 2024-01-25 NOTE — Assessment & Plan Note (Addendum)
 Lateral STEMI in September 2024.  She was treated with DES to the diagonal.  She had moderate residual disease in the LAD which is managed medically.  She is doing well without chest symptoms to suggest angina.  She is more than a year post-infarct. Brilinta  can be discontinued. - Discontinue Brilinta   - Continue aspirin  81 mg daily. - Continue rosuvastatin  40 mg daily. - Follow-up 1 year

## 2024-01-25 NOTE — Assessment & Plan Note (Signed)
 Blood pressure controlled. Continue metoprolol 12.5 mg daily.

## 2024-03-28 ENCOUNTER — Telehealth: Payer: Self-pay | Admitting: Cardiovascular Disease

## 2024-03-28 NOTE — Telephone Encounter (Signed)
" °*  STAT* If patient is at the pharmacy, call can be transferred to refill team.   1. Which medications need to be refilled? (please list name of each medication and dose if known)   pantoprazole  (PROTONIX ) 40 MG tablet    rosuvastatin  (CRESTOR ) 40 MG tablet     2. Would you like to learn more about the convenience, safety, & potential cost savings by using the [facility]? No   3. Are you open to using the [facility] ([facility].  ). No    4. Which pharmacy/location (including street and city if local pharmacy) is medication to be sent to?[facility] - [place], [place] - [address]    5. Do they need a 30 day or 90 day supply? 90  Pt is currently out  "

## 2024-03-29 MED ORDER — ROSUVASTATIN CALCIUM 40 MG PO TABS: 40.0000 mg | ORAL_TABLET | Freq: Every day | ORAL | 3 refills | Status: AC

## 2024-03-29 MED ORDER — PANTOPRAZOLE SODIUM 40 MG PO TBEC: 40.0000 mg | DELAYED_RELEASE_TABLET | Freq: Every day | ORAL | 3 refills | Status: AC

## 2024-03-29 NOTE — Addendum Note (Signed)
 Addended by: DARIO IZETTA CROME on: 03/29/2024 01:29 PM   Modules accepted: Orders
